# Patient Record
Sex: Female | Born: 1935
Health system: Southern US, Community
[De-identification: ages and names within clinical notes are randomized; demographics above are authoritative.]

## PROBLEM LIST (undated history)

## (undated) DIAGNOSIS — E119 Type 2 diabetes mellitus without complications: Secondary | ICD-10-CM

## (undated) HISTORY — PX: EYE SURGERY: SHX253

## (undated) HISTORY — PX: CHOLECYSTECTOMY: SHX55

## (undated) HISTORY — PX: OTHER SURGICAL HISTORY: SHX169

---

## 2020-09-18 DIAGNOSIS — Z794 Long term (current) use of insulin: Secondary | ICD-10-CM | POA: Diagnosis not present

## 2020-09-18 DIAGNOSIS — E876 Hypokalemia: Secondary | ICD-10-CM | POA: Diagnosis not present

## 2020-09-18 DIAGNOSIS — E1169 Type 2 diabetes mellitus with other specified complication: Secondary | ICD-10-CM | POA: Diagnosis not present

## 2020-10-09 ENCOUNTER — Encounter: Payer: Self-pay | Admitting: Podiatry

## 2020-10-09 DIAGNOSIS — E11621 Type 2 diabetes mellitus with foot ulcer: Secondary | ICD-10-CM | POA: Diagnosis not present

## 2020-10-09 DIAGNOSIS — L97529 Non-pressure chronic ulcer of other part of left foot with unspecified severity: Secondary | ICD-10-CM | POA: Diagnosis not present

## 2020-10-09 DIAGNOSIS — Z794 Long term (current) use of insulin: Secondary | ICD-10-CM | POA: Diagnosis not present

## 2020-10-11 ENCOUNTER — Ambulatory Visit: Payer: Medicare HMO

## 2020-10-11 ENCOUNTER — Ambulatory Visit: Payer: Medicare HMO | Admitting: Podiatry

## 2020-10-11 ENCOUNTER — Other Ambulatory Visit: Payer: Self-pay

## 2020-10-11 DIAGNOSIS — E08621 Diabetes mellitus due to underlying condition with foot ulcer: Secondary | ICD-10-CM | POA: Diagnosis not present

## 2020-10-11 DIAGNOSIS — L97501 Non-pressure chronic ulcer of other part of unspecified foot limited to breakdown of skin: Secondary | ICD-10-CM | POA: Diagnosis not present

## 2020-10-14 ENCOUNTER — Encounter: Payer: Self-pay | Admitting: Podiatry

## 2020-10-14 NOTE — Progress Notes (Signed)
  Subjective:  Patient ID: Patricia Green, female    DOB: [birth year],  MRN: [id number]  Chief Complaint  Patient presents with  . Diabetic Ulcer    PCP has referred pt to get ulcer checked out- pt mentioned she has been applying ointments on wound to help it heal- further eval     87 y.o. female presents with the above complaint. History confirmed with patient.  Here with family today.  Referred by her PCP.  Has not had much drainage.  There has been slight bleeding.  Her A1c is "7 something".  Objective:  Physical Exam: warm, good capillary refill and normal DP and PT pulses.  Hammertoe deformities on left foot.  She has a submetatarsal ulceration measuring 1 cm x 0.3 cm x 0.2 cm after debridement.  Hyperkeratosis, no signs of infection     Assessment:   1. Diabetic ulcer of foot associated with diabetes mellitus due to underlying condition, limited to breakdown of skin, unspecified laterality, unspecified part of foot (HCC)      Plan:  Patient was evaluated and treated and all questions answered.  Patient educated on diabetes. Discussed proper diabetic foot care and discussed risks and complications of disease. Educated patient in depth on reasons to return to the office immediately should he/she discover anything concerning or new on the feet. All questions answered. Discussed proper shoes as well.   Ulcer submet 5 left -Debridement as below. -Dressed with Iodosorb, DSD. -Applying Bactroban at home, continue this -Surgical shoe with peg assist dispensed  Procedure: Excisional Debridement of Wound Rationale: Removal of non-viable soft tissue from the wound to promote healing.  Anesthesia: none Pre-Debridement Wound Measurements: Unmeasurable due to hyperkeratosis Post-Debridement Wound Measurements: 1 cm x 0.3 cm x 0.2 cm Type of Debridement: Sharp Excisional Tissue Removed: Non-viable soft tissue Depth of Debridement: subcutaneous tissue. Technique: Sharp excisional  debridement to bleeding, viable wound base.  Dressing: Dry, sterile, compression dressing. Disposition: Patient tolerated procedure well. Patient to return in 1 week for follow-up.  Return in about 3 weeks (around 11/01/2020).       Return in about 3 weeks (around 11/01/2020).

## 2020-11-01 ENCOUNTER — Other Ambulatory Visit: Payer: Self-pay

## 2020-11-01 ENCOUNTER — Ambulatory Visit: Payer: Medicare HMO | Admitting: Podiatry

## 2020-11-01 DIAGNOSIS — L97501 Non-pressure chronic ulcer of other part of unspecified foot limited to breakdown of skin: Secondary | ICD-10-CM

## 2020-11-01 DIAGNOSIS — E08621 Diabetes mellitus due to underlying condition with foot ulcer: Secondary | ICD-10-CM | POA: Diagnosis not present

## 2020-11-04 ENCOUNTER — Encounter: Payer: Self-pay | Admitting: Podiatry

## 2020-11-04 NOTE — Progress Notes (Signed)
  Subjective:  Patient ID: Patricia Green, female    DOB: Oct 04, 1929,  MRN: [id number]  Chief Complaint  Patient presents with  . Foot Ulcer    PT stated that she is doing okay she stated that she does still have some bleeding and some pain.     87 y.o. female returns with the above complaint. History confirmed with patient.  Here again with her son.Marland Kitchen  Referred by her PCP.  Overall seems to be improving  Objective:  Physical Exam: warm, good capillary refill and normal DP and PT pulses.  Hammertoe deformities on left foot.  She has a submetatarsal ulceration measuring 0.5 cm x 0.3 cm x 0.1 cm after debridement.  Hyperkeratosis, no signs of infection       Assessment:   No diagnosis found.   Plan:  Patient was evaluated and treated and all questions answered.  Patient educated on diabetes. Discussed proper diabetic foot care and discussed risks and complications of disease. Educated patient in depth on reasons to return to the office immediately should he/she discover anything concerning or new on the feet. All questions answered. Discussed proper shoes as well.   Ulcer submet 5 left -Debridement as below. -Dressed with Iodosorb, DSD. -Continue applying Bactroban at home, continue this -Surgical shoe with peg assist dispensed, she should continue wearing this it is been helping quite a bit  Procedure: Excisional Debridement of Wound Rationale: Removal of non-viable soft tissue from the wound to promote healing.  Anesthesia: none Pre-Debridement Wound Measurements: 0.3 x 0.1 x 0.1 Post-Debridement Wound Measurements: 0.5 cm x 0.3 cm x 0.1 cm  Type of Debridement Sharp selective l Tissue Removed: Non-viable soft tissue Depth of Debridement: subcutaneous tissue. Technique: Sharp selective debridement to bleeding, viable wound base.  Dressing: Dry, sterile, compression dressing. Disposition: Patient tolerated procedure well. Patient to return in 1 week for follow-up.  Return in  about 3 weeks (around 11/22/2020).       Return in about 3 weeks (around 11/22/2020).

## 2020-11-09 ENCOUNTER — Ambulatory Visit: Payer: Medicare HMO

## 2020-11-09 ENCOUNTER — Other Ambulatory Visit: Payer: Self-pay

## 2020-11-09 DIAGNOSIS — E08621 Diabetes mellitus due to underlying condition with foot ulcer: Secondary | ICD-10-CM

## 2020-11-09 DIAGNOSIS — L97501 Non-pressure chronic ulcer of other part of unspecified foot limited to breakdown of skin: Secondary | ICD-10-CM

## 2020-11-12 NOTE — Progress Notes (Signed)
Pt was measured for diabetic shoes and inserts. Pt did have a left plantar foot ulcer present. Area was clean and dry. Pt will be contacted when shoes are ready for pick up.

## 2020-11-26 ENCOUNTER — Ambulatory Visit: Payer: Medicare HMO | Admitting: Podiatry

## 2020-11-26 ENCOUNTER — Encounter: Payer: Self-pay | Admitting: Podiatry

## 2020-11-26 ENCOUNTER — Other Ambulatory Visit: Payer: Self-pay

## 2020-11-26 DIAGNOSIS — E08621 Diabetes mellitus due to underlying condition with foot ulcer: Secondary | ICD-10-CM

## 2020-11-26 DIAGNOSIS — L97501 Non-pressure chronic ulcer of other part of unspecified foot limited to breakdown of skin: Secondary | ICD-10-CM | POA: Diagnosis not present

## 2020-11-26 NOTE — Progress Notes (Signed)
  Subjective:  Patient ID: Patricia Green, female    DOB: 1930-03-26,  MRN: [id number]  Chief Complaint  Patient presents with  . Foot Ulcer    Left foot diabetic ulcer, PT stated that she is doing well she stated that she has no concerns and denies any pain    87 y.o. female returns with the above complaint. History confirmed with patient.  Here again with her son.Marland Kitchen  Referred by her PCP.  Overall seems to be improving  Objective:  Physical Exam: warm, good capillary refill and normal DP and PT pulses.  Hammertoe deformities on left foot.  She has a submetatarsal ulceration measuring 0.3 cm x 0.2 cm x 0.1 cm after debridement.  Hyperkeratosis, no signs of infection.  Post debridement the only thing remains a small scab of dried blood, has effectively healed       Assessment:   No diagnosis found.   Plan:  Patient was evaluated and treated and all questions answered.  Patient educated on diabetes. Discussed proper diabetic foot care and discussed risks and complications of disease. Educated patient in depth on reasons to return to the office immediately should he/she discover anything concerning or new on the feet. All questions answered. Discussed proper shoes as well.   Ulcer submet 5 left -Has effectively healed -We [place]ako see her back in a few weeks to make sure it remains healed -Debridement as below. -No dressing today -Surgical shoe with peg assist dispensed, she should continue wearing this for 1 additional week and then she can return to a soft supportive sneaker  Procedure: Excisional Debridement of Wound Rationale: Removal of non-viable soft tissue from the wound to promote healing.  Anesthesia: none Pre-Debridement Wound Measurements: 0.3 x 0.1 x 0.1 Post-Debridement Wound Measurements: 0.1 x 0.1 x0.1 Type of Debridement Sharp selective l Tissue Removed: Non-viable soft tissue Depth of Debridement: subcutaneous tissue. Technique: Sharp selective debridement to  bleeding, viable wound base.  Dressing: Dry, sterile, compression dressing. Disposition: Patient tolerated procedure well.   Return in about 3 weeks (around 12/17/2020) for wound re-check left foot.

## 2020-11-26 NOTE — Patient Instructions (Signed)
Wear the offloading shoe for 1 more week then wear a sneaker when walking. If the wound gets bigger, or has drainage, go back to the shoe.

## 2020-12-17 ENCOUNTER — Encounter: Payer: Self-pay | Admitting: Podiatry

## 2020-12-17 ENCOUNTER — Other Ambulatory Visit: Payer: Self-pay

## 2020-12-17 ENCOUNTER — Ambulatory Visit: Payer: Medicare HMO | Admitting: Podiatry

## 2020-12-17 DIAGNOSIS — E08621 Diabetes mellitus due to underlying condition with foot ulcer: Secondary | ICD-10-CM | POA: Diagnosis not present

## 2020-12-17 DIAGNOSIS — M2042 Other hammer toe(s) (acquired), left foot: Secondary | ICD-10-CM

## 2020-12-17 DIAGNOSIS — L97501 Non-pressure chronic ulcer of other part of unspecified foot limited to breakdown of skin: Secondary | ICD-10-CM | POA: Diagnosis not present

## 2020-12-17 DIAGNOSIS — L97522 Non-pressure chronic ulcer of other part of left foot with fat layer exposed: Secondary | ICD-10-CM

## 2020-12-17 DIAGNOSIS — M2041 Other hammer toe(s) (acquired), right foot: Secondary | ICD-10-CM

## 2020-12-17 NOTE — Progress Notes (Signed)
  Subjective:  Patient ID: Patricia Green, female    DOB: Jun 15, 1930,  MRN: [id number]  Chief Complaint  Patient presents with  . Diabetic Ulcer       3wk f/u diab ulcer left foot    87 y.o. female returns with the above complaint. History confirmed with patient.  Here again with her son.Marland Kitchen  Referred by her PCP.  Feels like it is gotten worse, has been draining again  Objective:  Physical Exam: warm, good capillary refill and normal DP and PT pulses.  Hammertoe deformities on left foot.  She has a submetatarsal 5 ulceration measuring 1.0 x 0.6 x 0.2 cm after debridement.  Surrounding hyperkeratosis, no signs of infection.  Submetatarsal callus on the left submet 1         Assessment:   1. Diabetic ulcer of left foot associated with diabetes mellitus due to underlying condition, with fat layer exposed, unspecified part of foot (HCC)   2. Hammertoe of left foot   3. Hammertoe of right foot      Plan:  Patient was evaluated and treated and all questions answered.  Patient educated on diabetes. Discussed proper diabetic foot care and discussed risks and complications of disease. Educated patient in depth on reasons to return to the office immediately should he/she discover anything concerning or new on the feet. All questions answered. Discussed proper shoes as well.   Ulcer submet 5 left -Unfortunate has returned, things will continue to wax and wane until we get her in her diabetic shoes with offloading insoles -Continue mupirocin daily -Recommend she return to her surgical shoe with peg assist device -Hopefully will have shoes by next visit  Procedure: Excisional Debridement of Wound Rationale: Removal of non-viable soft tissue from the wound to promote healing.  Anesthesia: none Pre-Debridement Wound Measurements: 0.3 x 0.2 x 0.1 cm Post-Debridement Wound Measurements: 1.0 x 0.6 x 0.2 cm  Type of Debridement Sharp excisional l Tissue Removed: Non-viable soft tissue Depth of  Debridement: subcutaneous tissue. Technique: Sharp excisional debridement to bleeding, viable wound base.  Dressing: Dry, sterile, compression dressing. Disposition: Patient tolerated procedure well.   No follow-ups on file.

## 2020-12-19 DIAGNOSIS — Z794 Long term (current) use of insulin: Secondary | ICD-10-CM | POA: Diagnosis not present

## 2020-12-19 DIAGNOSIS — E876 Hypokalemia: Secondary | ICD-10-CM | POA: Diagnosis not present

## 2020-12-19 DIAGNOSIS — R7989 Other specified abnormal findings of blood chemistry: Secondary | ICD-10-CM | POA: Diagnosis not present

## 2020-12-19 DIAGNOSIS — E1169 Type 2 diabetes mellitus with other specified complication: Secondary | ICD-10-CM | POA: Diagnosis not present

## 2020-12-20 ENCOUNTER — Emergency Department (HOSPITAL_COMMUNITY): Payer: Medicare HMO

## 2020-12-20 ENCOUNTER — Other Ambulatory Visit: Payer: Self-pay

## 2020-12-20 ENCOUNTER — Encounter (HOSPITAL_COMMUNITY): Payer: Self-pay | Admitting: Emergency Medicine

## 2020-12-20 ENCOUNTER — Inpatient Hospital Stay (HOSPITAL_COMMUNITY)
Admission: EM | Admit: 2020-12-20 | Discharge: 2020-12-27 | DRG: 522 | Disposition: A | Discharge status: Skilled Nursing Facility | Payer: Medicare HMO | Attending: Family Medicine | Admitting: Family Medicine

## 2020-12-20 DIAGNOSIS — Z20822 Contact with and (suspected) exposure to covid-19: Secondary | ICD-10-CM | POA: Diagnosis not present

## 2020-12-20 DIAGNOSIS — T383X5A Adverse effect of insulin and oral hypoglycemic [antidiabetic] drugs, initial encounter: Secondary | ICD-10-CM | POA: Diagnosis not present

## 2020-12-20 DIAGNOSIS — R0902 Hypoxemia: Secondary | ICD-10-CM | POA: Diagnosis not present

## 2020-12-20 DIAGNOSIS — E440 Moderate protein-calorie malnutrition: Secondary | ICD-10-CM | POA: Insufficient documentation

## 2020-12-20 DIAGNOSIS — S72001D Fracture of unspecified part of neck of right femur, subsequent encounter for closed fracture with routine healing: Secondary | ICD-10-CM | POA: Diagnosis not present

## 2020-12-20 DIAGNOSIS — F0390 Unspecified dementia without behavioral disturbance: Secondary | ICD-10-CM | POA: Diagnosis present

## 2020-12-20 DIAGNOSIS — E11621 Type 2 diabetes mellitus with foot ulcer: Secondary | ICD-10-CM

## 2020-12-20 DIAGNOSIS — D649 Anemia, unspecified: Secondary | ICD-10-CM | POA: Diagnosis not present

## 2020-12-20 DIAGNOSIS — Z7982 Long term (current) use of aspirin: Secondary | ICD-10-CM

## 2020-12-20 DIAGNOSIS — L97529 Non-pressure chronic ulcer of other part of left foot with unspecified severity: Secondary | ICD-10-CM | POA: Diagnosis present

## 2020-12-20 DIAGNOSIS — E11649 Type 2 diabetes mellitus with hypoglycemia without coma: Secondary | ICD-10-CM | POA: Diagnosis present

## 2020-12-20 DIAGNOSIS — E1122 Type 2 diabetes mellitus with diabetic chronic kidney disease: Secondary | ICD-10-CM | POA: Diagnosis present

## 2020-12-20 DIAGNOSIS — D72828 Other elevated white blood cell count: Secondary | ICD-10-CM | POA: Diagnosis present

## 2020-12-20 DIAGNOSIS — D631 Anemia in chronic kidney disease: Secondary | ICD-10-CM | POA: Diagnosis present

## 2020-12-20 DIAGNOSIS — M25572 Pain in left ankle and joints of left foot: Secondary | ICD-10-CM | POA: Diagnosis not present

## 2020-12-20 DIAGNOSIS — D62 Acute posthemorrhagic anemia: Secondary | ICD-10-CM | POA: Diagnosis not present

## 2020-12-20 DIAGNOSIS — Z681 Body mass index (BMI) 19 or less, adult: Secondary | ICD-10-CM | POA: Diagnosis not present

## 2020-12-20 DIAGNOSIS — E1165 Type 2 diabetes mellitus with hyperglycemia: Secondary | ICD-10-CM | POA: Diagnosis not present

## 2020-12-20 DIAGNOSIS — E160 Drug-induced hypoglycemia without coma: Secondary | ICD-10-CM

## 2020-12-20 DIAGNOSIS — Z471 Aftercare following joint replacement surgery: Secondary | ICD-10-CM | POA: Diagnosis not present

## 2020-12-20 DIAGNOSIS — Z794 Long term (current) use of insulin: Secondary | ICD-10-CM

## 2020-12-20 DIAGNOSIS — M25551 Pain in right hip: Secondary | ICD-10-CM | POA: Diagnosis not present

## 2020-12-20 DIAGNOSIS — W010XXA Fall on same level from slipping, tripping and stumbling without subsequent striking against object, initial encounter: Secondary | ICD-10-CM | POA: Diagnosis not present

## 2020-12-20 DIAGNOSIS — Z66 Do not resuscitate: Secondary | ICD-10-CM | POA: Diagnosis present

## 2020-12-20 DIAGNOSIS — S72011A Unspecified intracapsular fracture of right femur, initial encounter for closed fracture: Secondary | ICD-10-CM | POA: Diagnosis not present

## 2020-12-20 DIAGNOSIS — L97509 Non-pressure chronic ulcer of other part of unspecified foot with unspecified severity: Secondary | ICD-10-CM

## 2020-12-20 DIAGNOSIS — Z87891 Personal history of nicotine dependence: Secondary | ICD-10-CM | POA: Diagnosis not present

## 2020-12-20 DIAGNOSIS — W19XXXA Unspecified fall, initial encounter: Secondary | ICD-10-CM | POA: Diagnosis not present

## 2020-12-20 DIAGNOSIS — Z79899 Other long term (current) drug therapy: Secondary | ICD-10-CM

## 2020-12-20 DIAGNOSIS — E162 Hypoglycemia, unspecified: Secondary | ICD-10-CM | POA: Diagnosis not present

## 2020-12-20 DIAGNOSIS — Z743 Need for continuous supervision: Secondary | ICD-10-CM | POA: Diagnosis not present

## 2020-12-20 DIAGNOSIS — S72001A Fracture of unspecified part of neck of right femur, initial encounter for closed fracture: Secondary | ICD-10-CM | POA: Diagnosis not present

## 2020-12-20 DIAGNOSIS — Z96641 Presence of right artificial hip joint: Secondary | ICD-10-CM | POA: Diagnosis not present

## 2020-12-20 DIAGNOSIS — T148XXA Other injury of unspecified body region, initial encounter: Secondary | ICD-10-CM

## 2020-12-20 DIAGNOSIS — N1831 Chronic kidney disease, stage 3a: Secondary | ICD-10-CM | POA: Diagnosis present

## 2020-12-20 DIAGNOSIS — N179 Acute kidney failure, unspecified: Secondary | ICD-10-CM

## 2020-12-20 DIAGNOSIS — Z9049 Acquired absence of other specified parts of digestive tract: Secondary | ICD-10-CM

## 2020-12-20 DIAGNOSIS — S72031A Displaced midcervical fracture of right femur, initial encounter for closed fracture: Secondary | ICD-10-CM | POA: Diagnosis not present

## 2020-12-20 DIAGNOSIS — Z419 Encounter for procedure for purposes other than remedying health state, unspecified: Secondary | ICD-10-CM

## 2020-12-20 DIAGNOSIS — N183 Chronic kidney disease, stage 3 unspecified: Secondary | ICD-10-CM

## 2020-12-20 DIAGNOSIS — S72009A Fracture of unspecified part of neck of unspecified femur, initial encounter for closed fracture: Secondary | ICD-10-CM | POA: Diagnosis not present

## 2020-12-20 HISTORY — DX: Type 2 diabetes mellitus without complications: E11.9

## 2020-12-20 LAB — CBC
HCT: 35.8 % — ABNORMAL LOW (ref 36.0–46.0)
Hemoglobin: 11.9 g/dL — ABNORMAL LOW (ref 12.0–15.0)
MCH: 30.1 pg (ref 26.0–34.0)
MCHC: 33.2 g/dL (ref 30.0–36.0)
MCV: 90.4 fL (ref 80.0–100.0)
Platelets: 237 10*3/uL (ref 150–400)
RBC: 3.96 MIL/uL (ref 3.87–5.11)
RDW: 12.5 % (ref 11.5–15.5)
WBC: 17.4 10*3/uL — ABNORMAL HIGH (ref 4.0–10.5)
nRBC: 0 % (ref 0.0–0.2)

## 2020-12-20 LAB — TYPE AND SCREEN
ABO/RH(D): O POS
Antibody Screen: NEGATIVE

## 2020-12-20 LAB — BASIC METABOLIC PANEL
Anion gap: 9 (ref 5–15)
BUN: 37 mg/dL — ABNORMAL HIGH (ref 8–23)
CO2: 22 mmol/L (ref 22–32)
Calcium: 9.1 mg/dL (ref 8.9–10.3)
Chloride: 111 mmol/L (ref 98–111)
Creatinine, Ser: 1.09 mg/dL — ABNORMAL HIGH (ref 0.44–1.00)
GFR, Estimated: 49 mL/min — ABNORMAL LOW (ref >60)
Glucose, Bld: 48 mg/dL — ABNORMAL LOW (ref 70–99)
Potassium: 4 mmol/L (ref 3.5–5.1)
Sodium: 142 mmol/L (ref 135–145)

## 2020-12-20 LAB — HEMOGLOBIN A1C
Hgb A1c MFr Bld: 6 % — ABNORMAL HIGH (ref 4.8–5.6)
Mean Plasma Glucose: 125.5 mg/dL

## 2020-12-20 LAB — SURGICAL PCR SCREEN
MRSA, PCR: NEGATIVE
Staphylococcus aureus: NEGATIVE

## 2020-12-20 LAB — RESP PANEL BY RT-PCR (FLU A&B, COVID) ARPGX2
Influenza A by PCR: NEGATIVE
Influenza B by PCR: NEGATIVE
SARS Coronavirus 2 by RT PCR: NEGATIVE

## 2020-12-20 LAB — CBG MONITORING, ED: Glucose-Capillary: 129 mg/dL — ABNORMAL HIGH (ref 70–99)

## 2020-12-20 LAB — GLUCOSE, CAPILLARY
Glucose-Capillary: 123 mg/dL — ABNORMAL HIGH (ref 70–99)
Glucose-Capillary: 181 mg/dL — ABNORMAL HIGH (ref 70–99)

## 2020-12-20 MED ORDER — SODIUM CHLORIDE 0.9 % IV BOLUS
1000.0000 mL | Freq: Once | INTRAVENOUS | Status: AC
  Administered 2020-12-20: 1000 mL via INTRAVENOUS

## 2020-12-20 MED ORDER — TRANEXAMIC ACID-NACL 1000-0.7 MG/100ML-% IV SOLN
1000.0000 mg | INTRAVENOUS | Status: AC
  Administered 2020-12-21: 1000 mg via INTRAVENOUS
  Filled 2020-12-20: qty 100

## 2020-12-20 MED ORDER — HEPARIN SODIUM (PORCINE) 5000 UNIT/ML IJ SOLN
5000.0000 [IU] | Freq: Three times a day (TID) | INTRAMUSCULAR | Status: DC
  Administered 2020-12-20: 5000 [IU] via SUBCUTANEOUS
  Filled 2020-12-20: qty 1

## 2020-12-20 MED ORDER — TRANEXAMIC ACID 1000 MG/10ML IV SOLN
2000.0000 mg | INTRAVENOUS | Status: DC
  Filled 2020-12-20 (×2): qty 20

## 2020-12-20 MED ORDER — HYDROCODONE-ACETAMINOPHEN 5-325 MG PO TABS
1.0000 | ORAL_TABLET | Freq: Four times a day (QID) | ORAL | Status: DC | PRN
  Administered 2020-12-20 – 2020-12-21 (×2): 1 via ORAL
  Filled 2020-12-20 (×2): qty 1

## 2020-12-20 MED ORDER — INSULIN ASPART 100 UNIT/ML ~~LOC~~ SOLN: 0.0000 [IU] | Freq: Three times a day (TID) | SUBCUTANEOUS | Status: DC

## 2020-12-20 MED ORDER — DEXTROSE 50 % IV SOLN
25.0000 g | Freq: Once | INTRAVENOUS | Status: AC
  Administered 2020-12-20: 25 g via INTRAVENOUS
  Filled 2020-12-20: qty 50

## 2020-12-20 MED ORDER — POVIDONE-IODINE 10 % EX SWAB: 2.0000 "application " | Freq: Once | CUTANEOUS | Status: DC

## 2020-12-20 MED ORDER — ONDANSETRON HCL 4 MG/2ML IJ SOLN
4.0000 mg | Freq: Once | INTRAMUSCULAR | Status: AC
  Administered 2020-12-20: 4 mg via INTRAVENOUS
  Filled 2020-12-20: qty 2

## 2020-12-20 MED ORDER — HYDROMORPHONE HCL 1 MG/ML IJ SOLN
0.5000 mg | Freq: Once | INTRAMUSCULAR | Status: AC
  Administered 2020-12-20: 0.5 mg via INTRAVENOUS
  Filled 2020-12-20: qty 1

## 2020-12-20 MED ORDER — CEFAZOLIN SODIUM-DEXTROSE 2-4 GM/100ML-% IV SOLN
2.0000 g | INTRAVENOUS | Status: AC
  Administered 2020-12-21: 2 g via INTRAVENOUS
  Filled 2020-12-20: qty 100

## 2020-12-20 MED ORDER — MORPHINE SULFATE (PF) 2 MG/ML IV SOLN
0.5000 mg | INTRAVENOUS | Status: DC | PRN
  Administered 2020-12-20: 0.5 mg via INTRAVENOUS
  Filled 2020-12-20: qty 1

## 2020-12-20 NOTE — ED Triage Notes (Signed)
BIBA  Per EMS: Pt coming from home with a mechanical fall. Pt fell on R side. Pt complaining of R hip and leg pain. Shortening noted on R leg. Denies LOC; blood thinners.  Vitals WDL  156/94 94 HR  96% RA

## 2020-12-20 NOTE — Progress Notes (Signed)
I have talked with Dr. Denton Lank about this patient.  I will plan to perform surgery tomorrow at [place] [facility].  NPO after midnight.  Appreciate the transfer.  Hold lovenox.

## 2020-12-20 NOTE — ED Notes (Signed)
Pt given orange juice and water.

## 2020-12-20 NOTE — H&P (Signed)
History and Physical        Hospital Admission Note Date: 12/20/2020  Patient name: Patricia Green Medical record number: [id number] Date of birth: [birth year] Age: 87 y.o. Gender: female  PCP: Soundra Pilon, FNP    Chief Complaint    Chief Complaint  Patient presents with  . Fall      HPI:   This is an 87 year old female with a history of diabetes, left diabetic foot wound (sees podiatry) who presented to the ED s/p slip and fall with right hip pain at home prior to arrival today. Patient states that she has been wearing a boot prescribed by her podiatrist for her left foot wound. The boot slipped this morning on the floor prompting her to fall.   No loss of consciousness or head injury.  No neck, back, chest pain or shortness of breath.  No other issues.  Not on anticoagulants. Of note, she did take her insulin this morning but did not eat as she came to the ED.    ED Course: Afebrile, hemodynamically stable, on room air. Notable Labs: Glucose 48, BUN 37, creatinine 1.09, WBC 17.4, Hb 11.9, COVID-19 and flu negative and otherwise labs overall unremarkable. Notable Imaging: Right hip XR-subcapital right femoral neck fracture with displacement of fracture fragments. Patient received D50, Dilaudid, Zofran, 1 L NS bolus.  Orthopedic surgery consulted and plan for surgery tomorrow.    Vitals:   12/20/20 1115 12/20/20 1644  BP: (!) 149/63 (!) 151/89  Pulse: 77 89  Resp: (!) 24 18  Temp:  98.1 F (36.7 C)  SpO2: 95% 95%     Review of Systems:  Review of Systems  All other systems reviewed and are negative.   Medical/Social/Family History   Past Medical History: Past Medical History:  Diagnosis Date  . Diabetes mellitus without complication [facility])     Past Surgical History:  Procedure Laterality Date  . Bilateral Cataract extraction    . CHOLECYSTECTOMY    . EYE  SURGERY    . lumpectomy from both Breasts      Medications: Prior to Admission medications   Medication Sig Start Date End Date Taking? Authorizing Provider  aspirin 81 MG chewable tablet Chew 81 mg by mouth at bedtime.   Yes [provider]  insulin aspart (NOVOLOG FLEXPEN) 100 UNIT/ML FlexPen Inject 0-2 Units into the skin 3 (three) times daily between meals. Sliding scale   Yes [provider]  LEVEMIR FLEXTOUCH 100 UNIT/ML FlexPen Inject 10-18 Units into the skin See admin instructions. 18 units in the morning 10 units at night 10/11/20  Yes [provider]  Multiple Vitamin (MULTIVITAMIN WITH MINERALS) TABS tablet Take 1 tablet by mouth daily.   Yes [provider]  potassium chloride (MICRO-K) 10 MEQ CR capsule Take 10 mEq by mouth 2 (two) times daily. 10/11/20  Yes [provider]  ONETOUCH ULTRA test strip 1 each 3 (three) times daily. 11/25/20   [provider]    Allergies:  No Known Allergies  Social History:  reports that she has quit smoking. Her smoking use included cigarettes. She has never used smokeless tobacco. She reports that she does not drink alcohol and does  not use drugs.  Family History: History reviewed. No pertinent family history.   Objective   Physical Exam: Blood pressure (!) 151/89, pulse 89, temperature 98.1 F (36.7 C), temperature source Oral, resp. rate 18, SpO2 95 %.  Physical Exam Vitals and nursing note reviewed.  Constitutional:      Appearance: Normal appearance.  HENT:     Head: Normocephalic and atraumatic.  Eyes:     Conjunctiva/sclera: Conjunctivae normal.  Cardiovascular:     Rate and Rhythm: Normal rate and regular rhythm.     Pulses: Normal pulses.  Pulmonary:     Effort: Pulmonary effort is normal.     Breath sounds: Normal breath sounds.  Abdominal:     General: Abdomen is flat.     Palpations: Abdomen is soft.  Musculoskeletal:     Right lower leg: No edema.     Left  lower leg: No edema.     Comments: Slightly externally rotated right lower extremity  Skin:    Coloration: Skin is not jaundiced or pale.  Neurological:     Mental Status: She is alert. Mental status is at baseline.  Psychiatric:        Mood and Affect: Mood normal.        Behavior: Behavior normal.     LABS on Admission: I have personally reviewed all the labs and imaging below    Basic Metabolic Panel: Recent Labs  Lab 12/20/20 1039  NA 142  K 4.0  CL 111  CO2 22  GLUCOSE 48*  BUN 37*  CREATININE 1.09*  CALCIUM 9.1   Liver Function Tests: No results for input(s): AST, ALT, ALKPHOS, BILITOT, PROT, ALBUMIN in the last 168 hours. No results for input(s): LIPASE, AMYLASE in the last 168 hours. No results for input(s): AMMONIA in the last 168 hours. CBC: Recent Labs  Lab 12/20/20 1039  WBC 17.4*  HGB 11.9*  HCT 35.8*  MCV 90.4  PLT 237   Cardiac Enzymes: No results for input(s): CKTOTAL, CKMB, CKMBINDEX, TROPONINI in the last 168 hours. BNP: Invalid input(s): POCBNP CBG: Recent Labs  Lab 12/20/20 1519 12/20/20 1655  GLUCAP 129* 123*    Radiological Exams on Admission:  DG Chest 1 View  Result Date: 12/20/2020 CLINICAL DATA:  Fall with right hip pain. EXAM: CHEST  1 VIEW COMPARISON:  None. FINDINGS: Cardiac silhouette is normal in size and configuration. Normal mediastinal and hilar contours. Lungs are hyperexpanded, but clear. No pleural effusion or pneumothorax. Skeletal structures are grossly intact. IMPRESSION: No acute cardiopulmonary disease. Electronically Signed   By: Amie Portland M.D.   On: 12/20/2020 11:37   DG HIP UNILAT W OR W/O PELVIS 2-3 VIEWS RIGHT  Result Date: 12/20/2020 CLINICAL DATA:  Pain following fall EXAM: DG HIP (WITH OR WITHOUT PELVIS) 2-3V RIGHT COMPARISON:  None. FINDINGS: Frontal pelvis as well as frontal and lateral right hip images were obtained. There is a subcapital femoral neck fracture on the right. There is superior and lateral  displacement of the femoral neck and shaft with respect to the femoral head. No other fracture. No dislocation. Moderate symmetric narrowing noted each hip joint. IMPRESSION: Subcapital femoral neck fracture on the right with displacement of fracture fragments. No other fracture. No dislocation. Symmetric moderate narrowing of each hip joint noted. Electronically Signed   By: Bretta Bang III M.D.   On: 12/20/2020 11:37      EKG: normal EKG, normal sinus rhythm   A & P   Principal Problem:  Closed subcapital fracture of femur, right, initial encounter [facility]) Active Problems:   Type 2 diabetes mellitus with foot ulcer (HCC)   Hypoglycemia due to insulin   1. Acute right subcapital femoral neck fracture with displacement of fracture fragments s/p mechanical fall from standing height a. Fell after her LLE boot slipped on the floor b. Ortho consulted, plan for transfer to [facility] for surgery tomorrow c. NPO after midnight d. PT eval when able e. Pain management per hip fracture order set  2. Diabetes with hypoglycemia a. Hypoglycemia likely from taking insulin last night and this morning without eating during the day. Resolved with PO intake and D50 b. Heart healthy/carb modified diet c. Sensitive sliding scale for now  3. Left diabetic foot wound, stable a. Outpatient follow up  4. Reactive leukocytosis    DVT prophylaxis: heparin   Code Status: DNR  Diet: heart healthy/carb modified Family Communication: Admission, patients condition and plan of care including tests being ordered have been discussed with the patient who indicates understanding and agrees with the plan and Code Status. Patient's family was updated  Disposition Plan: The appropriate patient status for this patient is INPATIENT. Inpatient status is judged to be reasonable and necessary in order to provide the required intensity of service to ensure the patient's safety. The patient's presenting symptoms, physical  exam findings, and initial radiographic and laboratory data in the context of their chronic comorbidities is felt to place them at high risk for further clinical deterioration. Furthermore, it is not anticipated that the patient will be medically stable for discharge from the hospital within 2 midnights of admission. The following factors support the patient status of inpatient.   " The patient's presenting symptoms include fall. " The worrisome physical exam findings include right hip pain. " The initial radiographic and laboratory data are worrisome because of right hip fracture. " The chronic co-morbidities include diabetes.   * I certify that at the point of admission it is my clinical judgment that the patient will require inpatient hospital care spanning beyond 2 midnights from the point of admission due to high intensity of service, high risk for further deterioration and high frequency of surveillance required.*    Consultants  . ortho  Procedures  . none  Time Spent on Admission: 55 minutes    Jae Dire, DO [facility]  12/20/2020, 5:20 PM

## 2020-12-20 NOTE — Consult Note (Signed)
Reason for Consult:Right hip fx Referring Physician: Manus Rudd Time called: 1258 Time at bedside: 1413   Patricia Green is an 87 y.o. female.  HPI: Patricia Green was at home getting something out of a closet when she tripped and fell. She had immediate right hip pain and was only able to get up with her son's help. She was brought to the ED where x-rays showed a right hip fx and orthopedic surgery was consulted. She c/o localized pain to the hip. She does not usually ambulate with any assistive devices.  History reviewed. No pertinent past medical history.  History reviewed. No pertinent surgical history.  History reviewed. No pertinent family history.  Social History:  has no history on file for tobacco use, alcohol use, and drug use.  Allergies: No Known Allergies  Medications: I have reviewed the patient's current medications.  Results for orders placed or performed during the hospital encounter of 12/20/20 (from the past 48 hour(s))  CBC     Status: Abnormal   Collection Time: 12/20/20 10:39 AM  Result Value Ref Range   WBC 17.4 (H) 4.0 - 10.5 K/uL   RBC 3.96 3.87 - 5.11 MIL/uL   Hemoglobin 11.9 (L) 12.0 - 15.0 g/dL   HCT 01.6 (L) 01.0 - 93.2 %   MCV 90.4 80.0 - 100.0 fL   MCH 30.1 26.0 - 34.0 pg   MCHC 33.2 30.0 - 36.0 g/dL   RDW 35.5 73.2 - 20.2 %   Platelets 237 150 - 400 K/uL   nRBC 0.0 0.0 - 0.2 %    Comment: Performed at [facility], [address], [place], [place] [address]  Type and screen     Status: None   Collection Time: 12/20/20 10:39 AM  Result Value Ref Range   ABO/RH(D) O POS    Antibody Screen NEG    Sample Expiration      12/23/2020,2359 Performed at [facility], [address], [place], [place] [address]   Basic metabolic panel     Status: Abnormal   Collection Time: 12/20/20 10:39 AM  Result Value Ref Range   Sodium 142 135 - 145 mmol/L   Potassium 4.0 3.5 - 5.1 mmol/L   Chloride 111 98 - 111 mmol/L   CO2 22 22 -  32 mmol/L   Glucose, Bld 48 (L) 70 - 99 mg/dL    Comment: Glucose reference range applies only to samples taken after fasting for at least 8 hours.   BUN 37 (H) 8 - 23 mg/dL   Creatinine, Ser 2.83 (H) 0.44 - 1.00 mg/dL   Calcium 9.1 8.9 - 15.1 mg/dL   GFR, Estimated 49 (L) >60 mL/min    Comment: (NOTE) Calculated using the CKD-EPI Creatinine Equation (2021)    Anion gap 9 5 - 15    Comment: Performed at [facility], [address] [place], [place] [address]  Resp Panel by RT-PCR (Flu A&B, Covid) Nasopharyngeal Swab     Status: None   Collection Time: 12/20/20 10:39 AM   Specimen: Nasopharyngeal Swab; Nasopharyngeal(NP) swabs in vial transport medium  Result Value Ref Range   SARS Coronavirus 2 by RT PCR NEGATIVE NEGATIVE    Comment: (NOTE) SARS-CoV-2 target nucleic acids are NOT DETECTED.  The SARS-CoV-2 RNA is generally detectable in upper respiratory specimens during the acute phase of infection. The lowest concentration of SARS-CoV-2 viral copies this assay can detect is 138 copies/mL. A negative result does not preclude SARS-Cov-2 infection and  should not be used as the sole basis for treatment or other patient management decisions. A negative result may occur with  improper specimen collection/handling, submission of specimen other than nasopharyngeal swab, presence of viral mutation(s) within the areas targeted by this assay, and inadequate number of viral copies(<138 copies/mL). A negative result must be combined with clinical observations, patient history, and epidemiological information. The expected result is Negative.  Fact Sheet for Patients:  BloggerCourse.com  Fact Sheet for Healthcare Providers:  SeriousBroker.it  This test is no t yet approved or cleared by the [place] FDA and  has been authorized for detection and/or diagnosis of SARS-CoV-2 by FDA under an Emergency Use Authorization  (EUA). This EUA will remain  in effect (meaning this test can be used) for the duration of the COVID-19 declaration under Section 564(b)(1) of the Act, 21 U.S.C.section 360bbb-3(b)(1), unless the authorization is terminated  or revoked sooner.       Influenza A by PCR NEGATIVE NEGATIVE   Influenza B by PCR NEGATIVE NEGATIVE    Comment: (NOTE) The Xpert Xpress SARS-CoV-2/FLU/RSV plus assay is intended as an aid in the diagnosis of influenza from Nasopharyngeal swab specimens and should not be used as a sole basis for treatment. Nasal washings and aspirates are unacceptable for Xpert Xpress SARS-CoV-2/FLU/RSV testing.  Fact Sheet for Patients: BloggerCourse.com  Fact Sheet for Healthcare Providers: SeriousBroker.it  This test is not yet approved or cleared by the [place] FDA and has been authorized for detection and/or diagnosis of SARS-CoV-2 by FDA under an Emergency Use Authorization (EUA). This EUA will remain in effect (meaning this test can be used) for the duration of the COVID-19 declaration under Section 564(b)(1) of the Act, 21 U.S.C. section 360bbb-3(b)(1), unless the authorization is terminated or revoked.  Performed at [facility], [address], [place], [place] [address]     DG Chest 1 View  Result Date: 12/20/2020 CLINICAL DATA:  Fall with right hip pain. EXAM: CHEST  1 VIEW COMPARISON:  None. FINDINGS: Cardiac silhouette is normal in size and configuration. Normal mediastinal and hilar contours. Lungs are hyperexpanded, but clear. No pleural effusion or pneumothorax. Skeletal structures are grossly intact. IMPRESSION: No acute cardiopulmonary disease. Electronically Signed   By: Amie Portland M.D.   On: 12/20/2020 11:37   DG HIP UNILAT W OR W/O PELVIS 2-3 VIEWS RIGHT  Result Date: 12/20/2020 CLINICAL DATA:  Pain following fall EXAM: DG HIP (WITH OR WITHOUT PELVIS) 2-3V RIGHT COMPARISON:   None. FINDINGS: Frontal pelvis as well as frontal and lateral right hip images were obtained. There is a subcapital femoral neck fracture on the right. There is superior and lateral displacement of the femoral neck and shaft with respect to the femoral head. No other fracture. No dislocation. Moderate symmetric narrowing noted each hip joint. IMPRESSION: Subcapital femoral neck fracture on the right with displacement of fracture fragments. No other fracture. No dislocation. Symmetric moderate narrowing of each hip joint noted. Electronically Signed   By: Bretta Bang III M.D.   On: 12/20/2020 11:37    Review of Systems  HENT: Negative for ear discharge, ear pain, hearing loss and tinnitus.   Eyes: Negative for photophobia and pain.  Respiratory: Negative for cough and shortness of breath.   Cardiovascular: Negative for chest pain.  Gastrointestinal: Negative for abdominal pain, nausea and vomiting.  Genitourinary: Negative for dysuria, flank pain, frequency and urgency.  Musculoskeletal: Positive for arthralgias (Right hip). Negative for back pain, myalgias and neck  pain.  Neurological: Negative for dizziness and headaches.  Hematological: Does not bruise/bleed easily.  Psychiatric/Behavioral: The patient is not nervous/anxious.    Blood pressure (!) 149/63, pulse 77, temperature 98.5 F (36.9 C), temperature source Oral, resp. rate (!) 24, SpO2 95 %. Physical Exam Constitutional:      General: She is not in acute distress.    Appearance: She is well-developed. She is not diaphoretic.  HENT:     Head: Normocephalic and atraumatic.  Eyes:     General: No scleral icterus.       Right eye: No discharge.        Left eye: No discharge.     Conjunctiva/sclera: Conjunctivae normal.  Cardiovascular:     Rate and Rhythm: Normal rate and regular rhythm.  Pulmonary:     Effort: Pulmonary effort is normal. No respiratory distress.  Musculoskeletal:     Cervical back: Normal range of motion.      Comments: RLE No traumatic wounds, ecchymosis, or rash  Mod TTP hip  No knee or ankle effusion  Knee stable to varus/ valgus and anterior/posterior stress  Sens DPN, SPN, TN intact  Motor EHL, ext, flex, evers 5/5  DP 2+, PT 2+, No significant edema  Skin:    General: Skin is warm and dry.  Neurological:     Mental Status: She is alert.  Psychiatric:        Behavior: Behavior normal.     Assessment/Plan: Right hip fx -- Plan THA tomorrow with Dr. Roda Shutters at [facility]. Please keep NPO after MN. Multiple medical problems including DM, diabetic foot ulcer, and  Dementia -- Have asked IM to admit and manage. Appreciate their help.    Freeman Caldron, PA-C Orthopedic Surgery [phone] 12/20/2020, 2:19 PM

## 2020-12-20 NOTE — ED Provider Notes (Addendum)
[facility]-EMERGENCY DEPT Provider Note   CSN: [id number] Arrival date & time: 12/20/20  8502     History Chief Complaint  Patient presents with  . Fall    Patricia Green is a 87 y.o. female.  Patient presents via EMS s/p slip and fall with right hip pain at home, just pta today. Symptoms acute onset, moderate, constant, dull, non radiating, worse w movement. Denies prior hip injury. No faintness or dizziness prior to fall, felt normal, at baseline today including just prior to her mechanical fall. Denies other pain or injury. No headache or loc. No neck or back pain. No chest pain or sob. No abd pain or nv. Skin is intact. No RLE numbness or weakness. No anticoagulant use.   The history is provided by the patient.  Fall Pertinent negatives include no chest pain, no abdominal pain, no headaches and no shortness of breath.       History reviewed. No pertinent past medical history.  There are no problems to display for this patient.   History reviewed. No pertinent surgical history.   OB History   No obstetric history on file.     History reviewed. No pertinent family history.     Home Medications Prior to Admission medications   Medication Sig Start Date End Date Taking? Authorizing Provider  doxycycline (VIBRAMYCIN) 100 MG capsule Take 100 mg by mouth 2 (two) times daily. 10/09/20   [provider]  LEVEMIR FLEXTOUCH 100 UNIT/ML FlexPen Inject into the skin. 10/11/20   [provider]  ONETOUCH ULTRA test strip 1 each 3 (three) times daily. 11/25/20   [provider]  potassium chloride (MICRO-K) 10 MEQ CR capsule Take by mouth 2 (two) times daily. 10/11/20   [provider]    Allergies    Patient has no allergy information on record.  Review of Systems   Review of Systems  Constitutional: Negative for chills and fever.  HENT: Negative for sore throat.   Eyes: Negative for redness.  Respiratory: Negative for  shortness of breath.   Cardiovascular: Negative for chest pain.  Gastrointestinal: Negative for abdominal pain, nausea and vomiting.  Genitourinary: Negative for flank pain.  Musculoskeletal: Negative for back pain and neck pain.  Skin: Negative for wound.  Neurological: Negative for headaches.  Hematological: Does not bruise/bleed easily.  Psychiatric/Behavioral: Negative for confusion.    Physical Exam Updated Vital Signs BP (!) 137/110   Pulse 72   Temp 98.5 F (36.9 C) (Oral)   Resp 20   SpO2 100%   Physical Exam Vitals and nursing note reviewed.  Constitutional:      Appearance: Normal appearance. She is well-developed.  HENT:     Head: Atraumatic.     Nose: Nose normal.     Mouth/Throat:     Mouth: Mucous membranes are moist.  Eyes:     General: No scleral icterus.    Conjunctiva/sclera: Conjunctivae normal.     Pupils: Pupils are equal, round, and reactive to light.  Neck:     Trachea: No tracheal deviation.  Cardiovascular:     Rate and Rhythm: Normal rate and regular rhythm.     Pulses: Normal pulses.     Heart sounds: Normal heart sounds. No murmur heard. No friction rub. No gallop.   Pulmonary:     Effort: Pulmonary effort is normal. No respiratory distress.     Breath sounds: Normal breath sounds.  Chest:     Chest wall: No tenderness.  Abdominal:     General: Bowel sounds are normal. There is no distension.     Palpations: Abdomen is soft.     Tenderness: There is no abdominal tenderness. There is no guarding.  Genitourinary:    Comments: No cva tenderness.  Musculoskeletal:        General: No swelling.     Cervical back: Normal range of motion and neck supple. No rigidity. No muscular tenderness.     Comments: CTLS spine, non tender, aligned, no step off. RLE shortening, pain right hip. Distal pulses palp.   Skin:    General: Skin is warm and dry.     Findings: No rash.  Neurological:     Mental Status: She is alert.     Comments: Alert,  speech normal. Motor/sens grossly intact bil. RLE nvi.   Psychiatric:        Mood and Affect: Mood normal.     ED Results / Procedures / Treatments   Labs (all labs ordered are listed, but only abnormal results are displayed) Results for orders placed or performed during the hospital encounter of 12/20/20  Resp Panel by RT-PCR (Flu A&B, Covid) Nasopharyngeal Swab   Specimen: Nasopharyngeal Swab; Nasopharyngeal(NP) swabs in vial transport medium  Result Value Ref Range   SARS Coronavirus 2 by RT PCR NEGATIVE NEGATIVE   Influenza A by PCR NEGATIVE NEGATIVE   Influenza B by PCR NEGATIVE NEGATIVE  CBC  Result Value Ref Range   WBC 17.4 (H) 4.0 - 10.5 K/uL   RBC 3.96 3.87 - 5.11 MIL/uL   Hemoglobin 11.9 (L) 12.0 - 15.0 g/dL   HCT 35.3 (L) 61.4 - 43.1 %   MCV 90.4 80.0 - 100.0 fL   MCH 30.1 26.0 - 34.0 pg   MCHC 33.2 30.0 - 36.0 g/dL   RDW 54.0 08.6 - 76.1 %   Platelets 237 150 - 400 K/uL   nRBC 0.0 0.0 - 0.2 %  Basic metabolic panel  Result Value Ref Range   Sodium 142 135 - 145 mmol/L   Potassium 4.0 3.5 - 5.1 mmol/L   Chloride 111 98 - 111 mmol/L   CO2 22 22 - 32 mmol/L   Glucose, Bld 48 (L) 70 - 99 mg/dL   BUN 37 (H) 8 - 23 mg/dL   Creatinine, Ser 9.50 (H) 0.44 - 1.00 mg/dL   Calcium 9.1 8.9 - 93.2 mg/dL   GFR, Estimated 49 (L) >60 mL/min   Anion gap 9 5 - 15  Type and screen  Result Value Ref Range   ABO/RH(D) O POS    Antibody Screen NEG    Sample Expiration      12/23/2020,2359 Performed at [facility], [address], [place], [place] [address]    DG Chest 1 View  Result Date: 12/20/2020 CLINICAL DATA:  Fall with right hip pain. EXAM: CHEST  1 VIEW COMPARISON:  None. FINDINGS: Cardiac silhouette is normal in size and configuration. Normal mediastinal and hilar contours. Lungs are hyperexpanded, but clear. No pleural effusion or pneumothorax. Skeletal structures are grossly intact. IMPRESSION: No acute cardiopulmonary disease. Electronically  Signed   By: Amie Portland M.D.   On: 12/20/2020 11:37   DG HIP UNILAT W OR W/O PELVIS 2-3 VIEWS RIGHT  Result Date: 12/20/2020 CLINICAL DATA:  Pain following fall EXAM: DG HIP (WITH OR WITHOUT PELVIS) 2-3V RIGHT COMPARISON:  None. FINDINGS: Frontal pelvis as well as frontal and lateral right hip images were obtained. There is a subcapital  femoral neck fracture on the right. There is superior and lateral displacement of the femoral neck and shaft with respect to the femoral head. No other fracture. No dislocation. Moderate symmetric narrowing noted each hip joint. IMPRESSION: Subcapital femoral neck fracture on the right with displacement of fracture fragments. No other fracture. No dislocation. Symmetric moderate narrowing of each hip joint noted. Electronically Signed   By: Bretta Bang III M.D.   On: 12/20/2020 11:37    EKG None  Radiology DG Chest 1 View  Result Date: 12/20/2020 CLINICAL DATA:  Fall with right hip pain. EXAM: CHEST  1 VIEW COMPARISON:  None. FINDINGS: Cardiac silhouette is normal in size and configuration. Normal mediastinal and hilar contours. Lungs are hyperexpanded, but clear. No pleural effusion or pneumothorax. Skeletal structures are grossly intact. IMPRESSION: No acute cardiopulmonary disease. Electronically Signed   By: Amie Portland M.D.   On: 12/20/2020 11:37   DG HIP UNILAT W OR W/O PELVIS 2-3 VIEWS RIGHT  Result Date: 12/20/2020 CLINICAL DATA:  Pain following fall EXAM: DG HIP (WITH OR WITHOUT PELVIS) 2-3V RIGHT COMPARISON:  None. FINDINGS: Frontal pelvis as well as frontal and lateral right hip images were obtained. There is a subcapital femoral neck fracture on the right. There is superior and lateral displacement of the femoral neck and shaft with respect to the femoral head. No other fracture. No dislocation. Moderate symmetric narrowing noted each hip joint. IMPRESSION: Subcapital femoral neck fracture on the right with displacement of fracture fragments. No  other fracture. No dislocation. Symmetric moderate narrowing of each hip joint noted. Electronically Signed   By: Bretta Bang III M.D.   On: 12/20/2020 11:37    Procedures Procedures   Medications Ordered in ED Medications  HYDROmorphone (DILAUDID) injection 0.5 mg (has no administration in time range)  ondansetron (ZOFRAN) injection 4 mg (has no administration in time range)  sodium chloride 0.9 % bolus 1,000 mL (has no administration in time range)    ED Course  I have reviewed the triage vital signs and the nursing notes.  Pertinent labs & imaging results that were available during my care of the patient were reviewed by me and considered in my medical decision making (see chart for details).    MDM Rules/Calculators/A&P                         Iv ns. Dilaudid iv.   Reviewed nursing notes and prior charts for additional history.   Labs and imaging ordered.   Xrays reviewed/interpreted by me - right hip fx.  Labs reviewed/interpreted by me - chem normal.   Recheck, pain improved.   Orthopedics consulted. Discussed w Dr Yevette Edwards - he indicates tentatively that they will plan surgery for tomorrow.   Dr Roda Shutters called back to say that they will plan surgery tomorrow around 12 noon at [facility] - I discussed w hospitalist, so that he will admit to [facility].  Glucose is low. D50 iv, po fluids/food.   Hospitalists consulted. Discussed pt - will admit.      Final Clinical Impression(s) / ED Diagnoses Final diagnoses:  None    Rx / DC Orders ED Discharge Orders    None         Cathren Laine, MD 12/20/20 1334

## 2020-12-21 ENCOUNTER — Encounter (HOSPITAL_COMMUNITY): Payer: Self-pay | Admitting: Internal Medicine

## 2020-12-21 ENCOUNTER — Inpatient Hospital Stay (HOSPITAL_COMMUNITY): Payer: Medicare HMO

## 2020-12-21 ENCOUNTER — Inpatient Hospital Stay (HOSPITAL_COMMUNITY): Payer: Medicare HMO | Admitting: Anesthesiology

## 2020-12-21 ENCOUNTER — Encounter (HOSPITAL_COMMUNITY)
Admission: EM | Disposition: A | Discharge status: Skilled Nursing Facility | Payer: Self-pay | Source: Home / Self Care | Attending: Internal Medicine

## 2020-12-21 DIAGNOSIS — E440 Moderate protein-calorie malnutrition: Secondary | ICD-10-CM

## 2020-12-21 DIAGNOSIS — N179 Acute kidney failure, unspecified: Secondary | ICD-10-CM

## 2020-12-21 DIAGNOSIS — S72011A Unspecified intracapsular fracture of right femur, initial encounter for closed fracture: Secondary | ICD-10-CM | POA: Diagnosis not present

## 2020-12-21 DIAGNOSIS — S72001D Fracture of unspecified part of neck of right femur, subsequent encounter for closed fracture with routine healing: Secondary | ICD-10-CM

## 2020-12-21 HISTORY — PX: TOTAL HIP ARTHROPLASTY: SHX124

## 2020-12-21 LAB — GLUCOSE, CAPILLARY
Glucose-Capillary: 119 mg/dL — ABNORMAL HIGH (ref 70–99)
Glucose-Capillary: 100 mg/dL — ABNORMAL HIGH (ref 70–99)
Glucose-Capillary: 114 mg/dL — ABNORMAL HIGH (ref 70–99)
Glucose-Capillary: 123 mg/dL — ABNORMAL HIGH (ref 70–99)
Glucose-Capillary: 292 mg/dL — ABNORMAL HIGH (ref 70–99)
Glucose-Capillary: 244 mg/dL — ABNORMAL HIGH (ref 70–99)

## 2020-12-21 LAB — CBC
HCT: 32.9 % — ABNORMAL LOW (ref 36.0–46.0)
HCT: 32.5 % — ABNORMAL LOW (ref 36.0–46.0)
Hemoglobin: 11 g/dL — ABNORMAL LOW (ref 12.0–15.0)
Hemoglobin: 10.8 g/dL — ABNORMAL LOW (ref 12.0–15.0)
MCH: 30.6 pg (ref 26.0–34.0)
MCH: 30.2 pg (ref 26.0–34.0)
MCHC: 33.4 g/dL (ref 30.0–36.0)
MCHC: 33.2 g/dL (ref 30.0–36.0)
MCV: 91.6 fL (ref 80.0–100.0)
MCV: 90.8 fL (ref 80.0–100.0)
Platelets: 197 10*3/uL (ref 150–400)
Platelets: 186 10*3/uL (ref 150–400)
RBC: 3.59 MIL/uL — ABNORMAL LOW (ref 3.87–5.11)
RBC: 3.58 MIL/uL — ABNORMAL LOW (ref 3.87–5.11)
RDW: 12.4 % (ref 11.5–15.5)
RDW: 12.4 % (ref 11.5–15.5)
WBC: 11.7 10*3/uL — ABNORMAL HIGH (ref 4.0–10.5)
WBC: 17.7 10*3/uL — ABNORMAL HIGH (ref 4.0–10.5)
nRBC: 0 % (ref 0.0–0.2)
nRBC: 0 % (ref 0.0–0.2)

## 2020-12-21 LAB — BASIC METABOLIC PANEL
Anion gap: 6 (ref 5–15)
BUN: 26 mg/dL — ABNORMAL HIGH (ref 8–23)
CO2: 25 mmol/L (ref 22–32)
Calcium: 8.8 mg/dL — ABNORMAL LOW (ref 8.9–10.3)
Chloride: 108 mmol/L (ref 98–111)
Creatinine, Ser: 1.33 mg/dL — ABNORMAL HIGH (ref 0.44–1.00)
GFR, Estimated: 38 mL/min — ABNORMAL LOW (ref >60)
Glucose, Bld: 153 mg/dL — ABNORMAL HIGH (ref 70–99)
Potassium: 3.7 mmol/L (ref 3.5–5.1)
Sodium: 139 mmol/L (ref 135–145)

## 2020-12-21 LAB — TYPE AND SCREEN
ABO/RH(D): O POS
Antibody Screen: NEGATIVE

## 2020-12-21 LAB — CREATININE, SERUM
Creatinine, Ser: 1.23 mg/dL — ABNORMAL HIGH (ref 0.44–1.00)
GFR, Estimated: 42 mL/min — ABNORMAL LOW (ref >60)

## 2020-12-21 SURGERY — ARTHROPLASTY, HIP, TOTAL, ANTERIOR APPROACH
Anesthesia: Spinal | Site: Hip | Laterality: Right

## 2020-12-21 MED ORDER — MAGNESIUM CITRATE PO SOLN: 1.0000 | Freq: Once | ORAL | Status: DC | PRN

## 2020-12-21 MED ORDER — FENTANYL CITRATE (PF) 250 MCG/5ML IJ SOLN
INTRAMUSCULAR | Status: AC
  Filled 2020-12-21: qty 5

## 2020-12-21 MED ORDER — BUPIVACAINE IN DEXTROSE 0.75-8.25 % IT SOLN
INTRATHECAL | Status: DC | PRN
  Administered 2020-12-21: 1.8 mL via INTRATHECAL

## 2020-12-21 MED ORDER — GLUCERNA SHAKE PO LIQD
237.0000 mL | Freq: Three times a day (TID) | ORAL | Status: DC
  Administered 2020-12-21 – 2020-12-27 (×16): 237 mL via ORAL
  Filled 2020-12-21: qty 237

## 2020-12-21 MED ORDER — SODIUM CHLORIDE 0.9 % IR SOLN
Status: DC | PRN
  Administered 2020-12-21: 1000 mL

## 2020-12-21 MED ORDER — HYDROCODONE-ACETAMINOPHEN 7.5-325 MG PO TABS: 1.0000 | ORAL_TABLET | Freq: Three times a day (TID) | ORAL | 0 refills | Status: AC | PRN

## 2020-12-21 MED ORDER — METHOCARBAMOL 500 MG PO TABS
500.0000 mg | ORAL_TABLET | Freq: Four times a day (QID) | ORAL | Status: DC | PRN
  Administered 2020-12-25 – 2020-12-26 (×2): 500 mg via ORAL
  Filled 2020-12-21 (×4): qty 1

## 2020-12-21 MED ORDER — ORAL CARE MOUTH RINSE: 15.0000 mL | Freq: Once | OROMUCOSAL | Status: AC

## 2020-12-21 MED ORDER — BUPIVACAINE-MELOXICAM ER 400-12 MG/14ML IJ SOLN
INTRAMUSCULAR | Status: AC
  Filled 2020-12-21: qty 1

## 2020-12-21 MED ORDER — TRANEXAMIC ACID-NACL 1000-0.7 MG/100ML-% IV SOLN
INTRAVENOUS | Status: AC
  Filled 2020-12-21: qty 100

## 2020-12-21 MED ORDER — PROSOURCE PLUS PO LIQD
30.0000 mL | Freq: Two times a day (BID) | ORAL | Status: DC
  Administered 2020-12-22 – 2020-12-27 (×9): 30 mL via ORAL
  Filled 2020-12-21 (×11): qty 30

## 2020-12-21 MED ORDER — OXYCODONE HCL 5 MG/5ML PO SOLN
5.0000 mg | Freq: Once | ORAL | Status: DC | PRN
Start: 2020-12-21 — End: 2020-12-21

## 2020-12-21 MED ORDER — OXYCODONE HCL 5 MG PO TABS: 10.0000 mg | ORAL_TABLET | ORAL | Status: DC | PRN

## 2020-12-21 MED ORDER — PROPOFOL 1000 MG/100ML IV EMUL
INTRAVENOUS | Status: AC
  Filled 2020-12-21: qty 100

## 2020-12-21 MED ORDER — IRRISEPT - 450ML BOTTLE WITH 0.05% CHG IN STERILE WATER, USP 99.95% OPTIME
TOPICAL | Status: DC | PRN
  Administered 2020-12-21: 450 mL

## 2020-12-21 MED ORDER — VANCOMYCIN HCL 1000 MG IV SOLR
INTRAVENOUS | Status: AC
  Filled 2020-12-21: qty 1000

## 2020-12-21 MED ORDER — PHENOL 1.4 % MT LIQD: 1.0000 | OROMUCOSAL | Status: DC | PRN

## 2020-12-21 MED ORDER — ACETAMINOPHEN 500 MG PO TABS
1000.0000 mg | ORAL_TABLET | Freq: Four times a day (QID) | ORAL | Status: AC
  Administered 2020-12-21 – 2020-12-22 (×4): 1000 mg via ORAL
  Filled 2020-12-21 (×4): qty 2

## 2020-12-21 MED ORDER — SODIUM CHLORIDE 0.9 % IV SOLN: INTRAVENOUS | Status: DC

## 2020-12-21 MED ORDER — ENOXAPARIN SODIUM 40 MG/0.4ML ~~LOC~~ SOLN: 40.0000 mg | Freq: Every day | SUBCUTANEOUS | 0 refills | Status: AC

## 2020-12-21 MED ORDER — DOCUSATE SODIUM 100 MG PO CAPS
100.0000 mg | ORAL_CAPSULE | Freq: Two times a day (BID) | ORAL | Status: DC
  Administered 2020-12-22 – 2020-12-27 (×12): 100 mg via ORAL
  Filled 2020-12-21 (×12): qty 1

## 2020-12-21 MED ORDER — PHENYLEPHRINE HCL-NACL 10-0.9 MG/250ML-% IV SOLN
INTRAVENOUS | Status: DC | PRN
  Administered 2020-12-21: 40 ug/min via INTRAVENOUS

## 2020-12-21 MED ORDER — OXYCODONE HCL 5 MG PO TABS: 5.0000 mg | ORAL_TABLET | Freq: Once | ORAL | Status: DC | PRN

## 2020-12-21 MED ORDER — ONDANSETRON HCL 4 MG PO TABS: 4.0000 mg | ORAL_TABLET | Freq: Four times a day (QID) | ORAL | Status: DC | PRN

## 2020-12-21 MED ORDER — ACETAMINOPHEN 325 MG PO TABS
650.0000 mg | ORAL_TABLET | Freq: Once | ORAL | Status: AC
  Administered 2020-12-21: 650 mg via ORAL
  Filled 2020-12-21: qty 2

## 2020-12-21 MED ORDER — CHLORHEXIDINE GLUCONATE 0.12 % MT SOLN: 15.0000 mL | Freq: Once | OROMUCOSAL | Status: AC

## 2020-12-21 MED ORDER — SORBITOL 70 % SOLN
30.0000 mL | Freq: Every day | Status: DC | PRN
  Filled 2020-12-21: qty 30

## 2020-12-21 MED ORDER — POLYETHYLENE GLYCOL 3350 17 G PO PACK: 17.0000 g | PACK | Freq: Every day | ORAL | Status: DC | PRN

## 2020-12-21 MED ORDER — ALUM & MAG HYDROXIDE-SIMETH 200-200-20 MG/5ML PO SUSP: 30.0000 mL | ORAL | Status: DC | PRN

## 2020-12-21 MED ORDER — FENTANYL CITRATE (PF) 250 MCG/5ML IJ SOLN
INTRAMUSCULAR | Status: DC | PRN
  Administered 2020-12-21: 50 ug via INTRAVENOUS

## 2020-12-21 MED ORDER — OXYCODONE HCL 5 MG PO TABS
5.0000 mg | ORAL_TABLET | ORAL | Status: DC | PRN
  Administered 2020-12-23 (×3): 5 mg via ORAL
  Administered 2020-12-26 – 2020-12-27 (×2): 10 mg via ORAL
  Filled 2020-12-21 (×2): qty 2
  Filled 2020-12-21 (×2): qty 1
  Filled 2020-12-21: qty 2
  Filled 2020-12-21: qty 1

## 2020-12-21 MED ORDER — METHOCARBAMOL 1000 MG/10ML IJ SOLN
500.0000 mg | Freq: Four times a day (QID) | INTRAMUSCULAR | Status: DC | PRN
  Filled 2020-12-21: qty 5

## 2020-12-21 MED ORDER — CHLORHEXIDINE GLUCONATE 0.12 % MT SOLN
OROMUCOSAL | Status: AC
  Administered 2020-12-21: 15 mL via OROMUCOSAL
  Filled 2020-12-21: qty 15

## 2020-12-21 MED ORDER — 0.9 % SODIUM CHLORIDE (POUR BTL) OPTIME
TOPICAL | Status: DC | PRN
  Administered 2020-12-21: 1000 mL

## 2020-12-21 MED ORDER — ACETAMINOPHEN 325 MG PO TABS
325.0000 mg | ORAL_TABLET | Freq: Four times a day (QID) | ORAL | Status: DC | PRN
  Administered 2020-12-22 – 2020-12-25 (×7): 650 mg via ORAL
  Filled 2020-12-21 (×7): qty 2

## 2020-12-21 MED ORDER — VANCOMYCIN HCL 1 G IV SOLR
INTRAVENOUS | Status: DC | PRN
  Administered 2020-12-21: 1000 mg

## 2020-12-21 MED ORDER — TRANEXAMIC ACID 1000 MG/10ML IV SOLN
INTRAVENOUS | Status: DC | PRN
  Administered 2020-12-21: 2000 mg via TOPICAL

## 2020-12-21 MED ORDER — CEFAZOLIN SODIUM-DEXTROSE 2-4 GM/100ML-% IV SOLN
2.0000 g | Freq: Two times a day (BID) | INTRAVENOUS | Status: AC
  Administered 2020-12-22 – 2020-12-23 (×3): 2 g via INTRAVENOUS
  Filled 2020-12-21 (×3): qty 100

## 2020-12-21 MED ORDER — TRANEXAMIC ACID-NACL 1000-0.7 MG/100ML-% IV SOLN
1000.0000 mg | Freq: Once | INTRAVENOUS | Status: AC
  Administered 2020-12-21: 1000 mg via INTRAVENOUS
  Filled 2020-12-21: qty 100

## 2020-12-21 MED ORDER — ADULT MULTIVITAMIN W/MINERALS CH
1.0000 | ORAL_TABLET | Freq: Every day | ORAL | Status: DC
  Administered 2020-12-21 – 2020-12-27 (×7): 1 via ORAL
  Filled 2020-12-21 (×7): qty 1

## 2020-12-21 MED ORDER — HYDROMORPHONE HCL 1 MG/ML IJ SOLN: 0.5000 mg | INTRAMUSCULAR | Status: DC | PRN

## 2020-12-21 MED ORDER — KETAMINE HCL 50 MG/5ML IJ SOSY
PREFILLED_SYRINGE | INTRAMUSCULAR | Status: AC
  Filled 2020-12-21: qty 5

## 2020-12-21 MED ORDER — KETAMINE HCL 10 MG/ML IJ SOLN
INTRAMUSCULAR | Status: DC | PRN
  Administered 2020-12-21: 20 mg via INTRAVENOUS

## 2020-12-21 MED ORDER — PROPOFOL 500 MG/50ML IV EMUL
INTRAVENOUS | Status: DC | PRN
  Administered 2020-12-21: 25 ug/kg/min via INTRAVENOUS

## 2020-12-21 MED ORDER — FENTANYL CITRATE (PF) 100 MCG/2ML IJ SOLN: 25.0000 ug | INTRAMUSCULAR | Status: DC | PRN

## 2020-12-21 MED ORDER — BUPIVACAINE-MELOXICAM ER 400-12 MG/14ML IJ SOLN
INTRAMUSCULAR | Status: DC | PRN
  Administered 2020-12-21: 400 mg

## 2020-12-21 MED ORDER — LACTATED RINGERS IV SOLN: INTRAVENOUS | Status: DC

## 2020-12-21 MED ORDER — ENOXAPARIN SODIUM 30 MG/0.3ML ~~LOC~~ SOLN
30.0000 mg | SUBCUTANEOUS | Status: DC
  Administered 2020-12-22 – 2020-12-27 (×6): 30 mg via SUBCUTANEOUS
  Filled 2020-12-21 (×6): qty 0.3

## 2020-12-21 MED ORDER — EPHEDRINE SULFATE-NACL 50-0.9 MG/10ML-% IV SOSY
PREFILLED_SYRINGE | INTRAVENOUS | Status: DC | PRN
  Administered 2020-12-21 (×2): 5 mg via INTRAVENOUS

## 2020-12-21 MED ORDER — ALBUTEROL SULFATE HFA 108 (90 BASE) MCG/ACT IN AERS
INHALATION_SPRAY | RESPIRATORY_TRACT | Status: AC
  Filled 2020-12-21: qty 6.7

## 2020-12-21 MED ORDER — ONDANSETRON HCL 4 MG/2ML IJ SOLN: 4.0000 mg | Freq: Four times a day (QID) | INTRAMUSCULAR | Status: DC | PRN

## 2020-12-21 MED ORDER — MENTHOL 3 MG MT LOZG: 1.0000 | LOZENGE | OROMUCOSAL | Status: DC | PRN

## 2020-12-21 MED ORDER — POLYETHYLENE GLYCOL 3350 17 G PO PACK
17.0000 g | PACK | Freq: Every day | ORAL | Status: DC
  Administered 2020-12-22 – 2020-12-27 (×6): 17 g via ORAL
  Filled 2020-12-21 (×6): qty 1

## 2020-12-21 MED ORDER — ONDANSETRON HCL 4 MG/2ML IJ SOLN: 4.0000 mg | Freq: Once | INTRAMUSCULAR | Status: DC | PRN

## 2020-12-21 SURGICAL SUPPLY — 65 items
ARTICULEZE HEAD (Hips) ×2 IMPLANT
BAG DECANTER FOR FLEXI CONT (MISCELLANEOUS) ×2 IMPLANT
CELLS DAT CNTRL 66122 CELL SVR (MISCELLANEOUS) IMPLANT
COVER PERINEAL POST (MISCELLANEOUS) ×2 IMPLANT
COVER SURGICAL LIGHT HANDLE (MISCELLANEOUS) ×2 IMPLANT
COVER WAND RF STERILE (DRAPES) ×2 IMPLANT
DERMABOND ADHESIVE PROPEN (GAUZE/BANDAGES/DRESSINGS) ×1
DERMABOND ADVANCED .7 DNX6 (GAUZE/BANDAGES/DRESSINGS) IMPLANT
DRAPE C-ARM 42X72 X-RAY (DRAPES) ×2 IMPLANT
DRAPE POUCH INSTRU U-SHP 10X18 (DRAPES) ×2 IMPLANT
DRAPE STERI IOBAN 125X83 (DRAPES) ×2 IMPLANT
DRAPE U-SHAPE 47X51 STRL (DRAPES) ×4 IMPLANT
DRSG AQUACEL AG ADV 3.5X 6 (GAUZE/BANDAGES/DRESSINGS) ×1 IMPLANT
DRSG AQUACEL AG ADV 3.5X10 (GAUZE/BANDAGES/DRESSINGS) ×2 IMPLANT
DURAPREP 26ML APPLICATOR (WOUND CARE) ×4 IMPLANT
ELECT BLADE 4.0 EZ CLEAN MEGAD (MISCELLANEOUS) ×2
ELECT REM PT RETURN 9FT ADLT (ELECTROSURGICAL) ×2
ELECTRODE BLDE 4.0 EZ CLN MEGD (MISCELLANEOUS) ×1 IMPLANT
ELECTRODE REM PT RTRN 9FT ADLT (ELECTROSURGICAL) ×1 IMPLANT
GLOVE ECLIPSE 7.0 STRL STRAW (GLOVE) ×4 IMPLANT
GLOVE SKINSENSE NS SZ7.5 (GLOVE) ×1
GLOVE SKINSENSE STRL SZ7.5 (GLOVE) ×1 IMPLANT
GLOVE SURG SYN 7.5  E (GLOVE) ×4
GLOVE SURG SYN 7.5 E (GLOVE) ×4 IMPLANT
GLOVE SURG SYN 7.5 PF PI (GLOVE) ×4 IMPLANT
GLOVE SURG UNDER POLY LF SZ7 (GLOVE) ×2 IMPLANT
GOWN STRL REIN XL XLG (GOWN DISPOSABLE) ×2 IMPLANT
GOWN STRL REUS W/ TWL LRG LVL3 (GOWN DISPOSABLE) IMPLANT
GOWN STRL REUS W/ TWL XL LVL3 (GOWN DISPOSABLE) ×1 IMPLANT
GOWN STRL REUS W/TWL LRG LVL3 (GOWN DISPOSABLE)
GOWN STRL REUS W/TWL XL LVL3 (GOWN DISPOSABLE) ×1
HANDPIECE INTERPULSE COAX TIP (DISPOSABLE) ×1
HEAD ARTICULEZE (Hips) IMPLANT
HOOD PEEL AWAY FLYTE STAYCOOL (MISCELLANEOUS) ×4 IMPLANT
IV NS IRRIG 3000ML ARTHROMATIC (IV SOLUTION) ×2 IMPLANT
JET LAVAGE IRRISEPT WOUND (IRRIGATION / IRRIGATOR) ×2
KIT BASIN OR (CUSTOM PROCEDURE TRAY) ×2 IMPLANT
LAVAGE JET IRRISEPT WOUND (IRRIGATION / IRRIGATOR) ×1 IMPLANT
LINER NEUTRAL 52X36MM PLUS 4 (Liner) ×1 IMPLANT
MARKER SKIN DUAL TIP RULER LAB (MISCELLANEOUS) ×2 IMPLANT
NDL SPNL 18GX3.5 QUINCKE PK (NEEDLE) ×1 IMPLANT
NEEDLE SPNL 18GX3.5 QUINCKE PK (NEEDLE) ×2 IMPLANT
PACK TOTAL JOINT (CUSTOM PROCEDURE TRAY) ×2 IMPLANT
PACK UNIVERSAL I (CUSTOM PROCEDURE TRAY) ×2 IMPLANT
PIN SECTOR W/GRIP ACE CUP 52MM (Hips) ×1 IMPLANT
RETRACTOR WND ALEXIS 18 MED (MISCELLANEOUS) IMPLANT
RTRCTR WOUND ALEXIS 18CM MED (MISCELLANEOUS)
SAW OSC TIP CART 19.5X105X1.3 (SAW) ×2 IMPLANT
SCREW 6.5MMX25MM (Screw) ×1 IMPLANT
SET HNDPC FAN SPRY TIP SCT (DISPOSABLE) ×1 IMPLANT
STAPLER VISISTAT 35W (STAPLE) IMPLANT
STEM FEM SZ3 STD ACTIS (Stem) ×1 IMPLANT
SUT ETHIBOND 2 V 37 (SUTURE) ×2 IMPLANT
SUT VIC AB 0 CT1 27 (SUTURE) ×1
SUT VIC AB 0 CT1 27XBRD ANBCTR (SUTURE) ×1 IMPLANT
SUT VIC AB 1 CTX 36 (SUTURE) ×1
SUT VIC AB 1 CTX36XBRD ANBCTR (SUTURE) ×1 IMPLANT
SUT VIC AB 2-0 CT1 27 (SUTURE) ×2
SUT VIC AB 2-0 CT1 TAPERPNT 27 (SUTURE) ×2 IMPLANT
SYR 50ML LL SCALE MARK (SYRINGE) ×2 IMPLANT
TOWEL GREEN STERILE (TOWEL DISPOSABLE) ×2 IMPLANT
TRAY CATH 16FR W/PLASTIC CATH (SET/KITS/TRAYS/PACK) IMPLANT
TRAY FOLEY W/BAG SLVR 16FR (SET/KITS/TRAYS/PACK) ×1
TRAY FOLEY W/BAG SLVR 16FR ST (SET/KITS/TRAYS/PACK) ×1 IMPLANT
YANKAUER SUCT BULB TIP NO VENT (SUCTIONS) ×2 IMPLANT

## 2020-12-21 NOTE — Discharge Instructions (Signed)

## 2020-12-21 NOTE — Anesthesia Postprocedure Evaluation (Signed)
Anesthesia Post Note  Patient: Patricia Green  Procedure(s) Performed: TOTAL HIP ARTHROPLASTY ANTERIOR APPROACH (Right Hip)     Patient location during evaluation: PACU Anesthesia Type: Spinal Level of consciousness: oriented, awake and alert and awake Pain management: pain level controlled Vital Signs Assessment: post-procedure vital signs reviewed and stable Respiratory status: spontaneous breathing, respiratory function stable, patient connected to nasal cannula oxygen and nonlabored ventilation Cardiovascular status: blood pressure returned to baseline and stable Postop Assessment: no headache, no backache and no apparent nausea or vomiting Anesthetic complications: no   No complications documented.  Last Vitals:  Vitals:   12/21/20 1530 12/21/20 1540  BP: (!) 137/55 (!) 147/55  Pulse: (!) 57 (!) 59  Resp: 12 17  Temp: (!) 36.1 C   SpO2: 100% 100%    Last Pain:  Vitals:   12/21/20 1540  TempSrc:   PainSc: 0-No pain                 Cecile Hearing

## 2020-12-21 NOTE — Progress Notes (Signed)
Initial Nutrition Assessment  DOCUMENTATION CODES:  Non-severe (moderate) malnutrition in context of chronic illness  INTERVENTION:  Advance diet as medically able. Recommend regular diet given good BG control and only slightly elevated HbA1c.  Once medically able, add Glucerna Shake po TID, each supplement provides 220 kcal and 10 grams of protein.  Once medically able, add 30 ml ProSource Plus po BID, each supplement provides 100 kcal and 15 grams of protein.   Add MVI with minerals daily.  Consider oral bowel regimen given no BM in 4 days.  NUTRITION DIAGNOSIS:  Moderate Malnutrition related to chronic illness,other (see comment) (T2DM, age) as evidenced by mild fat depletion,moderate fat depletion,mild muscle depletion,moderate muscle depletion,severe muscle depletion,edema.  GOAL:  Patient will meet greater than or equal to 90% of their needs  MONITOR:  Diet advancement,Skin,Weight trends,Labs  REASON FOR ASSESSMENT:  Consult Hip fracture protocol  ASSESSMENT:  87 yo female with a PMH of T2DM and L diabetic foot wound (sees podiatrist outpt) who presents with a R closed subcapital femur fracture.  Spoke with pt and son at bedside. Pt in good spirits. Son and pt reports that at home, she's a big eater. She likes to have rice with soup, soup on its own, ham sandwiches, eggs, and hamburgers. She reports having a good appetite and no appetite changes recently. Son reports that they monitor strictly what she eats because she is a "brittle diabetic."  She denies any weight loss recently. No weight history given. On exam, pt has moderate depletions in both fat and muscle. Some of the depletion is likely d/t age, but some is likely d/t not getting enough nutrition. Pt at risk for severe malnutrition.  Pt has not had a bowel movement in 4 days. Secure chatted MD to make aware.  Pt is NPO for surgery now. Recommend adding Glucerna, per pt request, TID and adding Prosource Plus BID to  increase caloric and protein intake. Also recommend MVI with minerals. Recommend regular diet once diet is advanced.   If PO intake does not meet needs, consider other supplements given good diabetes control.  Relevant Medications: reviewed Labs: reviewed; CBG 48-181 HbA1c: 6.0% (12/2020)  NUTRITION - FOCUSED PHYSICAL EXAM: Flowsheet Row Most Recent Value  Orbital Region Moderate depletion  Upper Arm Region Moderate depletion  Thoracic and Lumbar Region Mild depletion  Buccal Region Moderate depletion  Temple Region Moderate depletion  Clavicle Bone Region Severe depletion  Clavicle and Acromion Bone Region Moderate depletion  Scapular Bone Region Unable to assess  Dorsal Hand Moderate depletion  Patellar Region Moderate depletion  Anterior Thigh Region Mild depletion  Posterior Calf Region Mild depletion  Edema (RD Assessment) Mild  [RLE]  Hair Reviewed  Eyes Reviewed  Mouth Reviewed  Skin Reviewed  Nails Reviewed     Diet Order:   Diet Order            Diet NPO time specified  Diet effective midnight                EDUCATION NEEDS:  Education needs have been addressed  Skin:  Skin Assessment: Skin Integrity Issues: Skin Integrity Issues:: Diabetic Ulcer Diabetic Ulcer: L toe  Last BM:  12/17/20  Height:  Ht Readings from Last 1 Encounters:  12/21/20 5\' 5"  (1.651 m)   Weight:  Wt Readings from Last 1 Encounters:  12/21/20 52.6 kg   Ideal Body Weight:  56 kg  BMI:  Body mass index is 19.3 kg/m.  Estimated Nutritional Needs:  Kcal:  1750-1950 Protein:  80-95 grams Fluid:  >1.75 L  Vertell Limber, RD, LDN Registered Dietitian After Hours/Weekend Pager # in [place]

## 2020-12-21 NOTE — Consult Note (Signed)
WOC Nurse Consult Note: Patient receiving care in [facility] 5N02. Patient was seen by Gaetano Hawthorne on 12/17/20 for the left foot wound Reason for Consult: left foot diabetic foot ulcer Wound type:neuropathic wound. See Dr. Vara Guardian note and photo from that date. Pressure Injury POA: Yes/No/NA Measurement: Wound bed: pink Drainage (amount, consistency, odor) some drainage Periwound: callus Dressing procedure/placement/frequency:  Cleanse left lateral foot wound with saline. Place a small piece of Aquacel [place] [id number]) in the wound bed, then a small foam dressing. Change daily. WOC nurse will not follow at this time.  Please re-consult the WOC team if needed.  Helmut Muster, RN, MSN, CWOCN, CNS-BC, pager [phone]

## 2020-12-21 NOTE — Progress Notes (Signed)
Pts HS CBG 244mg /dl. Pt with order to notify physician for glucose >140 mg/dl. Amion message sent to X. Blount. Will continue to monitor

## 2020-12-21 NOTE — Progress Notes (Addendum)
PROGRESS NOTE   Patricia Green  FXT:[id number]    DOB: 1929-09-17    DOA: 12/20/2020  PCP: Soundra Pilon, FNP   I have briefly reviewed patients previous medical records in [facility].  Chief Complaint  Patient presents with  . Fall    Brief Narrative:  87 year old female, lives with her brother and his family, independent and functional at baseline, medical history significant for but not limited to type II DM, left diabetic foot wound (sees podiatry), possible dementia, who presented initially to [facility] ED on 12/20/2020 after she slipped and fell and noted immediate right hip pain.  She had been wearing a boot on her left foot prescribed by the podiatrist and the boot slipped prompting her fall.  Admitted for right hip fracture and DM with hypoglycemia.  Orthopedics was consulted, patient transferred to [facility] and currently in OR for right total hip arthroplasty.   Assessment & Plan:  Principal Problem:   Closed subcapital fracture of femur, right, initial encounter [facility]) Active Problems:   Type 2 diabetes mellitus with foot ulcer (HCC)   Hypoglycemia due to insulin   Malnutrition of moderate degree   Acute right hip fracture/right subcapital femoral neck fracture with displacement of fracture fragments  Sustained due to mechanical fall at home after her LLE boot slipped on the floor.  Suspect underlying osteoporosis.  Will need vitamin D levels and bone density checked in treated as needed as outpatient.  Orthopedics consulted and patiently currently in OR for right total hip arthroplasty.  Based on data available, patient is at mild to moderate risk for perioperative CV events due to advanced age, frail physical status but is medically optimized to proceed with indicated surgery with close perioperative monitoring.  Acute kidney injury complicating stage IIIa CKD  Presented with creatinine of 1.09.  No prior results to compare.  Creatinine has  gone up to 1.33.  Unclear etiology.  May be due to volume depletion from poor oral intake related to pain etc.  Brief and gentle IV fluids.  Follow BMP in a.m.  Avoid nephrotoxic's.  Type II DM with hypoglycemia  Hypoglycemia likely from taking insulin without significant oral intake.  Resolved with oral intake and D50 in ED.  A1c 6 indicates tight control in this very elderly female.  Discontinued SSI for now.  Monitor CBGs closely and reinitiate if needed.  Left diabetic foot wound  Outpatient follow-up with podiatry.  WOC input appreciated.  Continue dressing changes per recommendations.  Anemia, may be due to chronic disease  Hemoglobin 11.9 on admission.  No baseline available.  Stable.  Follow CBC postoperatively.  Reactive leukocytosis  Almost resolved.  Body mass index is 19.3 kg/m.  Nutritional Status Nutrition Problem: Moderate Malnutrition Etiology: chronic illness,other (see comment) (T2DM, age) Signs/Symptoms: mild fat depletion,moderate fat depletion,mild muscle depletion,moderate muscle depletion,severe muscle depletion,edema Interventions: MVI,Glucerna shake,Prostat    DVT prophylaxis: Sequential compression device to OR Start: 12/20/20 1337     Code Status: DNR Family Communication: Discussed in detail with patient's brother at bedside, updated care and answered all questions Disposition:  Status is: Inpatient  Remains inpatient appropriate because:Inpatient level of care appropriate due to severity of illness   Dispo: The patient is from: Home              Anticipated d/c is to: SNF              Patient currently is not medically stable to d/c.  Difficult to place patient No        Consultants:   Orthopedic  Procedures:   In OR for right total hip arthroplasty 4/8  Antimicrobials:    Anti-infectives (From admission, onward)   Start     Dose/Rate Route Frequency Ordered Stop   12/21/20 1200  ceFAZolin (ANCEF) IVPB 2g/100 mL  premix        2 g 200 mL/hr over 30 Minutes Intravenous On call to O.R. 12/20/20 1336 12/22/20 0559        Subjective:  Interviewed and examined this morning in the presence of her brother at bedside.  Seen prior to procedure.  Reports that they live in a two-level house.  Patient independent and functional, able to climb up and down stairs, no cardiac history, no history of chest pain, dyspnea, palpitations, dizziness or lightheadedness.  Objective:   Vitals:   12/20/20 2347 12/21/20 0434 12/21/20 0838 12/21/20 1130  BP: (!) 138/55 134/62 (!) 141/62   Pulse: 96 73 78   Resp: 19 19 17    Temp: 98 F (36.7 C) 98.3 F (36.8 C) 98.7 F (37.1 C)   TempSrc: Oral Oral Oral   SpO2: 97% 99% 100%   Weight:    52.6 kg  Height:    5\' 5"  (1.651 m)    General exam: Pleasant elderly female, moderately built and frail, lying comfortably propped up in bed without distress.  Edentulous.  Oral mucosa with borderline hydration. Respiratory system: Clear to auscultation. Respiratory effort normal. Cardiovascular system: S1 & S2 heard, RRR. No JVD, murmurs, rubs, gallops or clicks. No pedal edema. Gastrointestinal system: Abdomen is nondistended, soft and nontender. No organomegaly or masses felt. Normal bowel sounds heard. Central nervous system: Alert and oriented to self and partly to place.  Follows simple instructions. No focal neurological deficits. Extremities: Symmetric 5 x 5 power except right lower extremity movements restricted due to pain.  Right lower extremity slightly shortened and externally rotated.  Right fifth toe deformed and overlaps the fourth toe. Skin: No rashes, lesions or ulcers Psychiatry: Judgement and insight appear somewhat impaired. Mood & affect pleasant and appropriate.     Data Reviewed:   I have personally reviewed following labs and imaging studies   CBC: Recent Labs  Lab 12/20/20 1039 12/21/20 0259  WBC 17.4* 11.7*  HGB 11.9* 11.0*  HCT 35.8* 32.9*   MCV 90.4 91.6  PLT 237 197    Basic Metabolic Panel: Recent Labs  Lab 12/20/20 1039 12/21/20 0259  NA 142 139  K 4.0 3.7  CL 111 108  CO2 22 25  GLUCOSE 48* 153*  BUN 37* 26*  CREATININE 1.09* 1.33*  CALCIUM 9.1 8.8*    Liver Function Tests: No results for input(s): AST, ALT, ALKPHOS, BILITOT, PROT, ALBUMIN in the last 168 hours.  CBG: Recent Labs  Lab 12/20/20 2054 12/21/20 0837 12/21/20 1118  GLUCAP 181* 119* 100*    Microbiology Studies:   Recent Results (from the past 240 hour(s))  Resp Panel by RT-PCR (Flu A&B, Covid) Nasopharyngeal Swab     Status: None   Collection Time: 12/20/20 10:39 AM   Specimen: Nasopharyngeal Swab; Nasopharyngeal(NP) swabs in vial transport medium  Result Value Ref Range Status   SARS Coronavirus 2 by RT PCR NEGATIVE NEGATIVE Final    Comment: (NOTE) SARS-CoV-2 target nucleic acids are NOT DETECTED.  The SARS-CoV-2 RNA is generally detectable in upper respiratory specimens during the acute phase of infection. The lowest concentration of SARS-CoV-2 viral copies  this assay can detect is 138 copies/mL. A negative result does not preclude SARS-Cov-2 infection and should not be used as the sole basis for treatment or other patient management decisions. A negative result may occur with  improper specimen collection/handling, submission of specimen other than nasopharyngeal swab, presence of viral mutation(s) within the areas targeted by this assay, and inadequate number of viral copies(<138 copies/mL). A negative result must be combined with clinical observations, patient history, and epidemiological information. The expected result is Negative.  Fact Sheet for Patients:  BloggerCourse.com  Fact Sheet for Healthcare Providers:  SeriousBroker.it  This test is no t yet approved or cleared by the [place] FDA and  has been authorized for detection and/or diagnosis of SARS-CoV-2  by FDA under an Emergency Use Authorization (EUA). This EUA will remain  in effect (meaning this test can be used) for the duration of the COVID-19 declaration under Section 564(b)(1) of the Act, 21 U.S.C.section 360bbb-3(b)(1), unless the authorization is terminated  or revoked sooner.       Influenza A by PCR NEGATIVE NEGATIVE Final   Influenza B by PCR NEGATIVE NEGATIVE Final    Comment: (NOTE) The Xpert Xpress SARS-CoV-2/FLU/RSV plus assay is intended as an aid in the diagnosis of influenza from Nasopharyngeal swab specimens and should not be used as a sole basis for treatment. Nasal washings and aspirates are unacceptable for Xpert Xpress SARS-CoV-2/FLU/RSV testing.  Fact Sheet for Patients: BloggerCourse.com  Fact Sheet for Healthcare Providers: SeriousBroker.it  This test is not yet approved or cleared by the [place] FDA and has been authorized for detection and/or diagnosis of SARS-CoV-2 by FDA under an Emergency Use Authorization (EUA). This EUA will remain in effect (meaning this test can be used) for the duration of the COVID-19 declaration under Section 564(b)(1) of the Act, 21 U.S.C. section 360bbb-3(b)(1), unless the authorization is terminated or revoked.  Performed at [facility], [address], [place], [place] [address]   Surgical pcr screen     Status: None   Collection Time: 12/20/20  5:46 PM   Specimen: Nasal Mucosa; Nasal Swab  Result Value Ref Range Status   MRSA, PCR NEGATIVE NEGATIVE Final   Staphylococcus aureus NEGATIVE NEGATIVE Final    Comment: (NOTE) The Xpert SA Assay (FDA approved for NASAL specimens in patients 2 years of age and older), is one component of a comprehensive surveillance program. It is not intended to diagnose infection nor to guide or monitor treatment. Performed at [facility], [address], [place], [place] [address]       Radiology Studies:  DG Chest 1 View  Result Date: 12/20/2020 CLINICAL DATA:  Fall with right hip pain. EXAM: CHEST  1 VIEW COMPARISON:  None. FINDINGS: Cardiac silhouette is normal in size and configuration. Normal mediastinal and hilar contours. Lungs are hyperexpanded, but clear. No pleural effusion or pneumothorax. Skeletal structures are grossly intact. IMPRESSION: No acute cardiopulmonary disease. Electronically Signed   By: Amie Portland M.D.   On: 12/20/2020 11:37   DG HIP UNILAT W OR W/O PELVIS 2-3 VIEWS RIGHT  Result Date: 12/20/2020 CLINICAL DATA:  Pain following fall EXAM: DG HIP (WITH OR WITHOUT PELVIS) 2-3V RIGHT COMPARISON:  None. FINDINGS: Frontal pelvis as well as frontal and lateral right hip images were obtained. There is a subcapital femoral neck fracture on the right. There is superior and lateral displacement of the femoral neck and shaft with respect to the femoral head. No other fracture. No  dislocation. Moderate symmetric narrowing noted each hip joint. IMPRESSION: Subcapital femoral neck fracture on the right with displacement of fracture fragments. No other fracture. No dislocation. Symmetric moderate narrowing of each hip joint noted. Electronically Signed   By: Bretta BangWilliam  Woodruff III M.D.   On: 12/20/2020 11:37     Scheduled Meds:   . (feeding supplement) PROSource Plus  30 mL Oral BID BM  . chlorhexidine  15 mL Mouth/Throat Once   Or  . mouth rinse  15 mL Mouth Rinse Once  . chlorhexidine      . feeding supplement (GLUCERNA SHAKE)  237 mL Oral TID BM  . multivitamin with minerals  1 tablet Oral Daily  . povidone-iodine  2 application Topical Once  . tranexamic acid (CYKLOKAPRON) topical - INTRAOP  2,000 mg Topical To OR    Continuous Infusions:   . sodium chloride 50 mL/hr at 12/21/20 0751  .  ceFAZolin (ANCEF) IV    . lactated ringers    . tranexamic acid       LOS: 1 day     Marcellus ScottAnand Braylee Lal, MD, [place]ontFACP, Arrowhead Regional Medical CenterFHM. [facility]    To contact  the attending provider between 7A-7P or the covering provider during after hours 7P-7A, please log into the web site [email or web address] and access using universal [place] password for that web site. If you do not have the password, please call the hospital operator.  12/21/2020, 12:08 PM

## 2020-12-21 NOTE — Transfer of Care (Signed)
Immediate Anesthesia Transfer of Care Note  Patient: Patricia Green  Procedure(s) Performed: TOTAL HIP ARTHROPLASTY ANTERIOR APPROACH (Right Hip)  Patient Location: PACU  Anesthesia Type:Spinal  Level of Consciousness: awake, alert  and oriented  Airway & Oxygen Therapy: Patient Spontanous Breathing  Post-op Assessment: Report given to RN and Post -op Vital signs reviewed and stable  Post vital signs: Reviewed and stable   Last Vitals:  Vitals Value Taken Time  BP    Temp    Pulse    Resp    SpO2      Last Pain:  Vitals:   12/21/20 1205  TempSrc:   PainSc: 0-No pain      Patients Stated Pain Goal: 3 (12/21/20 1205)  Complications: No complications documented.

## 2020-12-21 NOTE — Op Note (Addendum)
TOTAL HIP ARTHROPLASTY ANTERIOR APPROACH  Procedure Note Patricia Green   [id number]  Pre-op Diagnosis: Right subcapital femoral neck fracture     Post-op Diagnosis: same   Operative Procedures  1. Total hip replacement; Right hip; uncemented [id number]   Surgeon: Gershon Mussel, M.D.  Assist: Oneal Grout, PA-C   Anesthesia: spinal  Prosthesis: Depuy Acetabulum: Pinnacle 52 mm Femur: Actis 3 STD Head: 36 mm size: +5 Liner: +4  Bearing Type: metal/poly  Total Hip Arthroplasty (Anterior Approach) Op Note:  After informed consent was obtained and the operative extremity marked in the holding area, the patient was brought back to the operating room and placed supine on the HANA table. Next, the operative extremity was prepped and draped in normal sterile fashion. Surgical timeout occurred verifying patient identification, surgical site, surgical procedure and administration of antibiotics.  A bikini type incision was made in the groin region distal to the ASIS in line with Langer's lines and then a modified anterior Smith-Peterson approach to the hip was performed, using the interval between tensor fascia lata and sartorius.  Dissection was carried bluntly down onto the anterior hip capsule. The lateral femoral circumflex vessels were identified and coagulated. A capsulotomy was performed and there was return of fracture hematoma and the capsular flaps tagged for later repair.  The neck osteotomy was performed below the fracture. The femoral head was removed which showed moderate degenerative wear, the acetabular rim was cleared of soft tissue and attention was turned to reaming the acetabulum.  Sequential reaming was performed under fluoroscopic guidance. We reamed to a size 51 mm, and then impacted the acetabular shell. A 25 mm cancellous screw was placed through the shell for added fixation.  The liner was then placed after irrigation and attention turned to the femur.  After placing  the femoral hook, the leg was taken to externally rotated, extended and adducted position taking care to perform soft tissue releases to allow for adequate mobilization of the femur. Soft tissue was cleared from the shoulder of the greater trochanter and the hook elevator used to improve exposure of the proximal femur. Sequential broaching performed up to a size 3. Trial neck and head were placed. The leg was brought back up to neutral and the construct reduced.  Antibiotic irrigation was placed in the surgical wound and kept for at least 1 minute.  The position and sizing of components, offset and leg lengths were checked using fluoroscopy. Stability of the construct was checked in extension and external rotation without any subluxation or impingement of prosthesis. We dislocated the prosthesis, dropped the leg back into position, removed trial components, and irrigated copiously. The final stem and head was then placed, the leg brought back up, the system reduced and fluoroscopy used to verify positioning.  We irrigated, obtained hemostasis and closed the capsule using #2 ethibond suture.  One gram of vancomycin powder was placed in the surgical bed.   One gram of topical tranexamic acid was injected into the joint.  The fascia was closed with #1 vicryl plus, the deep fat layer was closed with 0 vicryl, the subcutaneous layers closed with 2.0 Vicryl Plus and the skin closed with 2.0 nylon and dermabond. A sterile dressing was applied. The patient was awakened in the operating room and taken to recovery in stable condition.  All sponge, needle, and instrument counts were correct at the end of the case.   Tessa Lerner, my PA, was a medical necessity for opening, closing, limb positioning,  retracting, exposing, and overall facilitation and timely completion of the surgery.  Position: supine  Complications: see description of procedure.  Time Out: performed   Drains/Packing: none  Estimated blood loss:  see anesthesia record  Returned to Recovery Room: in good condition.   Antibiotics: yes   Mechanical VTE (DVT) Prophylaxis: sequential compression devices, TED thigh-high  Chemical VTE (DVT) Prophylaxis: lovenox   Fluid Replacement: see anesthesia record  Specimens Removed: 1 to pathology   Sponge and Instrument Count Correct? yes   PACU: portable radiograph - low AP   Plan/RTC: Return in 2 weeks for staple removal. Weight Bearing/Load Lower Extremity: full  Hip precautions: none Suture Removal: 2 weeks   N. Glee Arvin, MD Patricia Green 2:08 PM   Implant Name Type Inv. Item Serial No. Manufacturer Lot No. LRB No. Used Action  PIN SECTOR W/GRIP ACE CUP - [id number] Hips PIN SECTOR W/GRIP ACE CUP  [employer] [id number] Right 1 Implanted  LINER NEUTRAL 52X36MM PLUS 4 - [id number] Liner LINER NEUTRAL 52X36MM PLUS 4  [employer] [id number] Right 1 Implanted  SCREW 6.5MMX25MM - [id number] Screw SCREW 6.5MMX25MM  [employer] [id number] Right 1 Implanted  STEM FEM SZ3 STD ACTIS - [id number] Stem STEM FEM SZ3 STD ACTIS  [employer] [id number] Right 1 Implanted  ARTICULEZE HEAD - [id number] Hips ARTICULEZE HEAD  [employer] [id number] Right 1 Implanted

## 2020-12-21 NOTE — H&P (Signed)

## 2020-12-21 NOTE — Plan of Care (Signed)

## 2020-12-21 NOTE — Anesthesia Preprocedure Evaluation (Addendum)
Anesthesia Evaluation  Patient identified by MRN, date of birth, ID band Patient awake    Reviewed: Allergy & Precautions, NPO status , Patient's Chart, lab work & pertinent test results  Airway Mallampati: I  TM Distance: >3 FB Neck ROM: Full    Dental no notable dental hx. (+) Dental Advisory Given   Pulmonary neg pulmonary ROS, former smoker,    Pulmonary exam normal breath sounds clear to auscultation       Cardiovascular negative cardio ROS Normal cardiovascular exam Rhythm:Regular Rate:Normal     Neuro/Psych negative neurological ROS  negative psych ROS   GI/Hepatic negative GI ROS, Neg liver ROS,   Endo/Other  diabetes, Well Controlled, Type 2, Insulin DependentFS 100 in preop  Renal/GU Renal InsufficiencyRenal diseaseCr 1.33  negative genitourinary   Musculoskeletal Right hip fx   Abdominal   Peds  Hematology  (+) Blood dyscrasia, anemia , 11/32.9, plt 197 Last dose SQ heparin 2100 last night   Anesthesia Other Findings   Reproductive/Obstetrics negative OB ROS                            Anesthesia Physical Anesthesia Plan  ASA: II  Anesthesia Plan: Spinal and MAC   Post-op Pain Management:    Induction:   PONV Risk Score and Plan: 2 and Propofol infusion and TIVA  Airway Management Planned: Natural Airway and Nasal Cannula  Additional Equipment: None  Intra-op Plan:   Post-operative Plan:   Informed Consent: I have reviewed the patients History and Physical, chart, labs and discussed the procedure including the risks, benefits and alternatives for the proposed anesthesia with the patient or authorized representative who has indicated his/her understanding and acceptance.   Patient has DNR.  Discussed DNR with patient, Discussed DNR with power of attorney and Suspend DNR.     Plan Discussed with: CRNA  Anesthesia Plan Comments:        Anesthesia Quick  Evaluation

## 2020-12-21 NOTE — Anesthesia Procedure Notes (Addendum)
Spinal  Patient location during procedure: OR Start time: 12/21/2020 12:56 PM End time: 12/21/2020 1:00 PM Reason for block: surgical anesthesia Staffing Performed: anesthesiologist  Anesthesiologist: Lannie Fields, DO Preanesthetic Checklist Completed: patient identified, IV checked, risks and benefits discussed, surgical consent, monitors and equipment checked, pre-op evaluation and timeout performed Spinal Block Patient position: right lateral decubitus Prep: DuraPrep and site prepped and draped Patient monitoring: cardiac monitor, continuous pulse ox and blood pressure Approach: midline Location: L3-4 Injection technique: single-shot Needle Needle type: Pencan  Needle gauge: 24 G Needle length: 9 cm Assessment Sensory level: T6 Events: CSF return Additional Notes Functioning IV was confirmed and monitors were applied. Sterile prep and drape, including hand hygiene and sterile gloves were used. The patient was positioned and the spine was prepped. The skin was anesthetized with lidocaine.  Free flow of clear CSF was obtained prior to injecting local anesthetic into the CSF.  The spinal needle aspirated freely following injection.  The needle was carefully withdrawn.  The patient tolerated the procedure well.

## 2020-12-22 LAB — CBC
HCT: 30.1 % — ABNORMAL LOW (ref 36.0–46.0)
Hemoglobin: 10.1 g/dL — ABNORMAL LOW (ref 12.0–15.0)
MCH: 30.7 pg (ref 26.0–34.0)
MCHC: 33.6 g/dL (ref 30.0–36.0)
MCV: 91.5 fL (ref 80.0–100.0)
Platelets: 162 10*3/uL (ref 150–400)
RBC: 3.29 MIL/uL — ABNORMAL LOW (ref 3.87–5.11)
RDW: 12.4 % (ref 11.5–15.5)
WBC: 13.3 10*3/uL — ABNORMAL HIGH (ref 4.0–10.5)
nRBC: 0 % (ref 0.0–0.2)

## 2020-12-22 LAB — BASIC METABOLIC PANEL
Anion gap: 5 (ref 5–15)
BUN: 20 mg/dL (ref 8–23)
CO2: 26 mmol/L (ref 22–32)
Calcium: 8.7 mg/dL — ABNORMAL LOW (ref 8.9–10.3)
Chloride: 104 mmol/L (ref 98–111)
Creatinine, Ser: 1.33 mg/dL — ABNORMAL HIGH (ref 0.44–1.00)
GFR, Estimated: 38 mL/min — ABNORMAL LOW (ref >60)
Glucose, Bld: 196 mg/dL — ABNORMAL HIGH (ref 70–99)
Potassium: 4.1 mmol/L (ref 3.5–5.1)
Sodium: 135 mmol/L (ref 135–145)

## 2020-12-22 LAB — GLUCOSE, CAPILLARY
Glucose-Capillary: 188 mg/dL — ABNORMAL HIGH (ref 70–99)
Glucose-Capillary: 273 mg/dL — ABNORMAL HIGH (ref 70–99)
Glucose-Capillary: 209 mg/dL — ABNORMAL HIGH (ref 70–99)
Glucose-Capillary: 104 mg/dL — ABNORMAL HIGH (ref 70–99)

## 2020-12-22 MED ORDER — ASPIRIN 81 MG PO CHEW
81.0000 mg | CHEWABLE_TABLET | Freq: Every day | ORAL | Status: DC
  Administered 2020-12-22 – 2020-12-26 (×5): 81 mg via ORAL
  Filled 2020-12-22 (×5): qty 1

## 2020-12-22 MED ORDER — INSULIN ASPART 100 UNIT/ML ~~LOC~~ SOLN: 0.0000 [IU] | Freq: Every day | SUBCUTANEOUS | Status: DC

## 2020-12-22 MED ORDER — HYDROMORPHONE HCL 1 MG/ML IJ SOLN
INTRAMUSCULAR | Status: AC
  Filled 2020-12-22: qty 0.5

## 2020-12-22 MED ORDER — INSULIN DETEMIR 100 UNIT/ML ~~LOC~~ SOLN
10.0000 [IU] | Freq: Two times a day (BID) | SUBCUTANEOUS | Status: DC
  Administered 2020-12-22 (×2): 10 [IU] via SUBCUTANEOUS
  Filled 2020-12-22 (×4): qty 0.1

## 2020-12-22 MED ORDER — PROPOFOL 10 MG/ML IV BOLUS
INTRAVENOUS | Status: AC
  Filled 2020-12-22: qty 20

## 2020-12-22 MED ORDER — INSULIN ASPART 100 UNIT/ML ~~LOC~~ SOLN
0.0000 [IU] | Freq: Three times a day (TID) | SUBCUTANEOUS | Status: DC
  Administered 2020-12-22 – 2020-12-23 (×2): 3 [IU] via SUBCUTANEOUS
  Administered 2020-12-23: 2 [IU] via SUBCUTANEOUS
  Administered 2020-12-24: 3 [IU] via SUBCUTANEOUS
  Administered 2020-12-24 (×2): 2 [IU] via SUBCUTANEOUS
  Administered 2020-12-25: 1 [IU] via SUBCUTANEOUS
  Administered 2020-12-25: 5 [IU] via SUBCUTANEOUS
  Administered 2020-12-25: 3 [IU] via SUBCUTANEOUS

## 2020-12-22 MED ORDER — SODIUM CHLORIDE 0.9 % IV SOLN: INTRAVENOUS | Status: DC

## 2020-12-22 NOTE — Progress Notes (Signed)
PROGRESS NOTE   Patricia Green  ZOX:[id number]    DOB: 1929-09-21    DOA: 12/20/2020  PCP: Soundra Pilon, FNP   I have briefly reviewed patients previous medical records in [facility].  Chief Complaint  Patient presents with  . Fall    Brief Narrative:  87 year old female, lives with her brother and his family, independent and functional at baseline, medical history significant for but not limited to type II DM, left diabetic foot wound (sees podiatry), possible dementia, who presented initially to [facility] ED on 12/20/2020 after she slipped and fell and noted immediate right hip pain.  She had been wearing a boot on her left foot prescribed by the podiatrist and the boot slipped prompting her fall.  Admitted for right hip fracture and DM with hypoglycemia.  Orthopedics was consulted, patient transferred to [facility] and s/p right total hip arthroplasty 4/8.   Assessment & Plan:  Principal Problem:   Closed subcapital fracture of femur, right, initial encounter [facility]) Active Problems:   Type 2 diabetes mellitus with foot ulcer (HCC)   Hypoglycemia due to insulin   Malnutrition of moderate degree   Acute right hip fracture/right subcapital femoral neck fracture with displacement of fracture fragments, s/p right total hip arthroplasty 4/8 with  Sustained due to mechanical fall at home after her LLE boot slipped on the floor.  Suspect underlying osteoporosis.  Will need vitamin D levels and bone density checked in treated as needed as outpatient.  Orthopedics consulted and patiently underwent right total hip arthroplasty 4/8.  Multimodality pain control, DVT prophylaxis, weightbearing as per orthopedic team.  PT evaluated and recommend SNF.  Acute kidney injury complicating stage IIIa CKD  Presented with creatinine of 1.09.  No prior results to compare.  Creatinine has gone up to 1.33 >1.23 > 1.33  Unclear etiology.  May be due to volume depletion from  poor oral intake related to pain etc.  Continue IV fluids for another 24 hours.  Follow BMP in a.m.  Avoid nephrotoxic's.  Type II DM with hypoglycemia/hyperglycemia  Hypoglycemia on admission likely from taking insulin without significant oral intake.  Resolved with oral intake and D50 in ED.  A1c 6 indicates tight control in this very elderly female.  Now hyperglycemic.  Resumed Levemir at reduced dose and SSI.  Monitor closely  Left diabetic foot wound  Outpatient follow-up with podiatry.  WOC input appreciated.  Continue dressing changes per recommendations.  Anemia, may be due to chronic disease  Hemoglobin 11.9 on admission.  No baseline available.  Stable.  Continue to trend CBCs.  Reactive leukocytosis  Mild.  No infectious etiology suspected.  Follow CBC  Body mass index is 19.3 kg/m.  Nutritional Status Nutrition Problem: Moderate Malnutrition Etiology: chronic illness,other (see comment) (T2DM, age) Signs/Symptoms: mild fat depletion,moderate fat depletion,mild muscle depletion,moderate muscle depletion,severe muscle depletion,edema Interventions: MVI,Glucerna shake,Prostat    DVT prophylaxis: enoxaparin (LOVENOX) injection 30 mg Start: 12/22/20 0800 SCDs Start: 12/21/20 1614     Code Status: DNR Family Communication: Discussed in detail with patient's brother at bedside on 4/8, updated care and answered all questions.  None at bedside today Disposition:  Status is: Inpatient  Remains inpatient appropriate because:Inpatient level of care appropriate due to severity of illness   Dispo: The patient is from: Home              Anticipated d/c is to: SNF  Patient currently is not medically stable to d/c.   Difficult to place patient No        Consultants:   Orthopedic  Procedures:   S/p right total hip arthroplasty 12/30/22  Antimicrobials:    Anti-infectives (From admission, onward)   Start     Dose/Rate Route Frequency Ordered Stop    12/22/20 0100  ceFAZolin (ANCEF) IVPB 2g/100 mL premix        2 g 200 mL/hr over 30 Minutes Intravenous Every 12 hours 2020/12/29 1613 12/23/20 1259   2020/12/29 1339  vancomycin (VANCOCIN) powder  Status:  Discontinued          As needed 29-Dec-2020 1339 12-29-2020 1439   December 29, 2020 1200  ceFAZolin (ANCEF) IVPB 2g/100 mL premix        2 g 200 mL/hr over 30 Minutes Intravenous On call to O.R. 12/20/20 1336 12/29/20 1320        Subjective:  Patient seen and examined along with her to female nurses at bedside.  Up in chair.  Expected mild to moderate right hip pain.  Foley catheter out and has urinated.  Asking why she is not getting her insulins.  Wants to know when she can walk.  Objective:   Vitals:   Dec 29, 2020 2017 12/22/20 0026 12/22/20 0400 12/22/20 0819  BP: (!) 122/45 (!) 149/77 (!) 121/47 (!) 109/47  Pulse: 99 90 93 87  Resp: 16 16 15 14   Temp: 98.8 F (37.1 C) 98 F (36.7 C) 98.6 F (37 C) 98.4 F (36.9 C)  TempSrc:  Oral Oral Oral  SpO2: 98% 100% 99% 97%  Weight:      Height:        General exam: Pleasant elderly female, moderately built and frail, sitting up comfortably in reclining chair without distress. Respiratory system: Clear to auscultation.  No increased work of breathing. Cardiovascular system: S1 & S2 heard, RRR. No JVD, murmurs, rubs, gallops or clicks. No pedal edema. Gastrointestinal system: Abdomen is nondistended, soft and nontender. No organomegaly or masses felt. Normal bowel sounds heard. Central nervous system: Alert and oriented to self and partly to place.  Follows simple instructions. No focal neurological deficits. Extremities: Symmetric 5 x 5 power.  Right hip surgical site dressing clean and dry Skin: No rashes, lesions or ulcers Psychiatry: Judgement and insight appear somewhat impaired. Mood & affect pleasant and appropriate.     Data Reviewed:   I have personally reviewed following labs and imaging studies   CBC: Recent Labs  Lab  12/29/20 0259 2020/12/29 1728 12/22/20 0122  WBC 11.7* 17.7* 13.3*  HGB 11.0* 10.8* 10.1*  HCT 32.9* 32.5* 30.1*  MCV 91.6 90.8 91.5  PLT 197 186 162    Basic Metabolic Panel: Recent Labs  Lab 12/20/20 1039 2020-12-29 0259 December 29, 2020 1728 12/22/20 0122  NA 142 139  --  135  K 4.0 3.7  --  4.1  CL 111 108  --  104  CO2 22 25  --  26  GLUCOSE 48* 153*  --  196*  BUN 37* 26*  --  20  CREATININE 1.09* 1.33* 1.23* 1.33*  CALCIUM 9.1 8.8*  --  8.7*    Liver Function Tests: No results for input(s): AST, ALT, ALKPHOS, BILITOT, PROT, ALBUMIN in the last 168 hours.  CBG: Recent Labs  Lab 2020-12-29 2236 12/22/20 0638 12/22/20 1136  GLUCAP 244* 188* 273*    Microbiology Studies:   Recent Results (from the past 240 hour(s))  Resp Panel by RT-PCR (  Flu A&B, Covid) Nasopharyngeal Swab     Status: None   Collection Time: 12/20/20 10:39 AM   Specimen: Nasopharyngeal Swab; Nasopharyngeal(NP) swabs in vial transport medium  Result Value Ref Range Status   SARS Coronavirus 2 by RT PCR NEGATIVE NEGATIVE Final    Comment: (NOTE) SARS-CoV-2 target nucleic acids are NOT DETECTED.  The SARS-CoV-2 RNA is generally detectable in upper respiratory specimens during the acute phase of infection. The lowest concentration of SARS-CoV-2 viral copies this assay can detect is 138 copies/mL. A negative result does not preclude SARS-Cov-2 infection and should not be used as the sole basis for treatment or other patient management decisions. A negative result may occur with  improper specimen collection/handling, submission of specimen other than nasopharyngeal swab, presence of viral mutation(s) within the areas targeted by this assay, and inadequate number of viral copies(<138 copies/mL). A negative result must be combined with clinical observations, patient history, and epidemiological information. The expected result is Negative.  Fact Sheet for Patients:   BloggerCourse.comhttps://www.fda.gov/media/152166/download  Fact Sheet for Healthcare Providers:  SeriousBroker.ithttps://www.fda.gov/media/152162/download  This test is no t yet approved or cleared by the [place] FDA and  has been authorized for detection and/or diagnosis of SARS-CoV-2 by FDA under an Emergency Use Authorization (EUA). This EUA will remain  in effect (meaning this test can be used) for the duration of the COVID-19 declaration under Section 564(b)(1) of the Act, 21 U.S.C.section 360bbb-3(b)(1), unless the authorization is terminated  or revoked sooner.       Influenza A by PCR NEGATIVE NEGATIVE Final   Influenza B by PCR NEGATIVE NEGATIVE Final    Comment: (NOTE) The Xpert Xpress SARS-CoV-2/FLU/RSV plus assay is intended as an aid in the diagnosis of influenza from Nasopharyngeal swab specimens and should not be used as a sole basis for treatment. Nasal washings and aspirates are unacceptable for Xpert Xpress SARS-CoV-2/FLU/RSV testing.  Fact Sheet for Patients: BloggerCourse.comhttps://www.fda.gov/media/152166/download  Fact Sheet for Healthcare Providers: SeriousBroker.ithttps://www.fda.gov/media/152162/download  This test is not yet approved or cleared by the [place] FDA and has been authorized for detection and/or diagnosis of SARS-CoV-2 by FDA under an Emergency Use Authorization (EUA). This EUA will remain in effect (meaning this test can be used) for the duration of the COVID-19 declaration under Section 564(b)(1) of the Act, 21 U.S.C. section 360bbb-3(b)(1), unless the authorization is terminated or revoked.  Performed at [facility], [address] [place], [place] [address]   Surgical pcr screen     Status: None   Collection Time: 12/20/20  5:46 PM   Specimen: Nasal Mucosa; Nasal Swab  Result Value Ref Range Status   MRSA, PCR NEGATIVE NEGATIVE Final   Staphylococcus aureus NEGATIVE NEGATIVE Final    Comment: (NOTE) The Xpert SA Assay (FDA approved for NASAL specimens in  patients 85 years of age and older), is one component of a comprehensive surveillance program. It is not intended to diagnose infection nor to guide or monitor treatment. Performed at [facility], [address] [place], [place] [address]      Radiology Studies:  DG Pelvis Portable  Result Date: 12/21/2020 CLINICAL DATA:  Fracture. EXAM: PORTABLE PELVIS 1-2 VIEWS COMPARISON:  X-ray C-arm 12/21/2020, x-ray hip 12/20/2020 FINDINGS: Status post open reduction and internal fixation of the right hip with total right hip arthroplasty. There is no evidence of new acute pelvic fracture or diastasis. No pelvic bone lesions are seen. Subcutaneus soft tissue edema and emphysema of the right hip consistent with  postsurgical changes. IMPRESSION: Status post total right hip arthroplasty. No radiographic findings of surgical complication. Electronically Signed   By: Tish Frederickson M.D.   On: 12/21/2020 23:31   DG C-Arm 1-60 Min  Result Date: 12/21/2020 CLINICAL DATA:  Total hip arthroplasty. EXAM: OPERATIVE RIGHT HIP (WITH PELVIS IF PERFORMED) TECHNIQUE: Fluoroscopic spot image(s) were submitted for interpretation post-operatively. COMPARISON:  Preoperative radiograph 12/20/2020 FINDINGS: Two fluoroscopic spot views of the right hip in frontal projection demonstrate total hip arthroplasty in expected alignment. Total fluoroscopy in 20.1 seconds. Total dose 1.2373 mGy. IMPRESSION: Procedural fluoroscopy for right hip arthroplasty. Electronically Signed   By: Narda Rutherford M.D.   On: 12/21/2020 15:11   DG HIP OPERATIVE UNILAT W OR W/O PELVIS RIGHT  Result Date: 12/21/2020 CLINICAL DATA:  Total hip arthroplasty. EXAM: OPERATIVE RIGHT HIP (WITH PELVIS IF PERFORMED) TECHNIQUE: Fluoroscopic spot image(s) were submitted for interpretation post-operatively. COMPARISON:  Preoperative radiograph 12/20/2020 FINDINGS: Two fluoroscopic spot views of the right hip in frontal projection demonstrate total hip  arthroplasty in expected alignment. Total fluoroscopy in 20.1 seconds. Total dose 1.2373 mGy. IMPRESSION: Procedural fluoroscopy for right hip arthroplasty. Electronically Signed   By: Narda Rutherford M.D.   On: 12/21/2020 15:11     Scheduled Meds:   . (feeding supplement) PROSource Plus  30 mL Oral BID BM  . docusate sodium  100 mg Oral BID  . enoxaparin (LOVENOX) injection  30 mg Subcutaneous Q24H  . feeding supplement (GLUCERNA SHAKE)  237 mL Oral TID BM  . insulin aspart  0-5 Units Subcutaneous QHS  . insulin aspart  0-9 Units Subcutaneous TID WC  . insulin detemir  10 Units Subcutaneous BID  . multivitamin with minerals  1 tablet Oral Daily  . polyethylene glycol  17 g Oral Daily    Continuous Infusions:   . sodium chloride 75 mL/hr at 12/22/20 0829  .  ceFAZolin (ANCEF) IV 2 g (12/22/20 1247)  . methocarbamol (ROBAXIN) IV       LOS: 2 days     Marcellus Scott, MD, [place], [facility]. [facility]    To contact the attending provider between 7A-7P or the covering provider during after hours 7P-7A, please log into the web site [email or web address] and access using universal [place] password for that web site. If you do not have the password, please call the hospital operator.  12/22/2020, 2:37 PM

## 2020-12-22 NOTE — Plan of Care (Signed)
  Problem: Education: Goal: Knowledge of General Education information will improve Description: Including pain rating scale, medication(s)/side effects and non-pharmacologic comfort measures Outcome: Progressing   Problem: Activity: Goal: Risk for activity intolerance will decrease Outcome: Progressing   Problem: Nutrition: Goal: Adequate nutrition will be maintained Outcome: Progressing   Problem: Elimination: Goal: Will not experience complications related to urinary retention Outcome: Progressing   Problem: Pain Managment: Goal: General experience of comfort will improve Outcome: Progressing   

## 2020-12-22 NOTE — Evaluation (Signed)
Physical Therapy Evaluation Patient Details Name: Patricia Green MRN: [id number] DOB: October 03, 1929 Today's Date: 12/22/2020   History of Present Illness  pt is 87 year old female with a history of diabetes, left diabetic foot wound (sees podiatry) who presented to the ED s/p slip and fall with right hip pain at home, x-rays showed a right hip fx; now s/p THA anterior approach.  Clinical Impression  Patient tolerated evaluation well. Her mobility distance is limited. She requires min a for strength and stability. She had only a minor increase in pain with transfers. She would benefit from rehab at a SNF in order to improve mobility to the point where she can safely get to her bedroom on the second floor. Acute therapy will continue to work with the patient.     Follow Up Recommendations SNF    Equipment Recommendations  None recommended by PT    Recommendations for Other Services       Precautions / Restrictions Precautions Precautions: Anterior Hip Precaution Booklet Issued: No Precaution Comments: WBAT Restrictions Weight Bearing Restrictions: Yes RUE Weight Bearing: Weight bearing as tolerated      Mobility  Bed Mobility               General bed mobility comments: Patient found in a chair. She is motivated to work with therapy    Transfers Overall transfer level: Needs assistance Equipment used: Agricultural consultant (2 wheeled) Transfers: Sit to/from [employer] to Stand: Min assist Stand pivot transfers: Min guard       General transfer comment: min a for strength and inital balance. Sit to stand 2x with therapy  Ambulation/Gait Ambulation/Gait assistance: Min assist Gait Distance (Feet): 15 Feet Assistive device: Standard walker Gait Pattern/deviations: Step-to pattern Gait velocity: decreased Gait velocity interpretation: <1.31 ft/sec, indicative of household ambulator General Gait Details: slow step too patern. Had minor looss of balance but  able to self correct  Stairs            Wheelchair Mobility    Modified Rankin (Stroke Patients Only)       Balance Overall balance assessment: Mild deficits observed, not formally tested                                           Pertinent Vitals/Pain Pain Assessment: 0-10 Pain Score: 3  Pain Location: R LE Pain Descriptors / Indicators: Aching;Discomfort;Dull;Guarding Pain Intervention(s): Premedicated before session;Repositioned;Monitored during session    Home Living Family/patient expects to be discharged to:: Private residence Living Arrangements: Other relatives Available Help at Discharge: Family Type of Home: House Home Access: Stairs to enter Entrance Stairs-Rails: Left Entrance Stairs-Number of Steps: 3 Home Layout: Two level;Bed/bath upstairs Home Equipment: None Additional Comments: brother confirms walk in shower available on main level master bath. but pt typically resides on second level for bathroom and bedroom. no other history of falls    Prior Function Level of Independence: Independent         Comments: no hx of falls, indep with all ADL's and mobility. recently increased difficulty d/t need for wearing boot per podiatry     Hand Dominance   Dominant Hand: Right    Extremity/Trunk Assessment   Upper Extremity Assessment Upper Extremity Assessment: Defer to OT evaluation    Lower Extremity Assessment Lower Extremity Assessment: RLE deficits/detail RLE Deficits / Details: right LE with active movement with  pain RLE: Unable to fully assess due to pain    Cervical / Trunk Assessment Cervical / Trunk Assessment: Normal  Communication   Communication: No difficulties  Cognition Arousal/Alertness: Awake/alert Behavior During Therapy: WFL for tasks assessed/performed Overall Cognitive Status: Within Functional Limits for tasks assessed                                        General Comments  General comments (skin integrity, edema, etc.): bandage noted    Exercises     Assessment/Plan    PT Assessment Patient needs continued PT services  PT Problem List Decreased strength;Decreased range of motion;Decreased activity tolerance;Decreased balance;Decreased mobility;Decreased knowledge of use of DME;Pain       PT Treatment Interventions DME instruction;Stair training;Gait training;Functional mobility training;Therapeutic activities;Therapeutic exercise;Neuromuscular re-education;Patient/family education    PT Goals (Current goals can be found in the Care Plan section)  Acute Rehab PT Goals Patient Stated Goal: to be able to go home and be indep PT Goal Formulation: With patient Time For Goal Achievement:  (w+1) Potential to Achieve Goals: Good    Frequency Min 3X/week   Barriers to discharge   bedroom is on the second floor    Co-evaluation               AM-PAC PT "6 Clicks" Mobility  Outcome Measure Help needed turning from your back to your side while in a flat bed without using bedrails?: A Little Help needed moving from lying on your back to sitting on the side of a flat bed without using bedrails?: A Little Help needed moving to and from a bed to a chair (including a wheelchair)?: A Little Help needed standing up from a chair using your arms (e.g., wheelchair or bedside chair)?: A Lot Help needed to walk in hospital room?: A Lot Help needed climbing 3-5 steps with a railing? : A Lot 6 Click Score: 15    End of Session Equipment Utilized During Treatment: Gait belt Activity Tolerance: Patient tolerated treatment well Patient left: in chair;with call bell/phone within reach;with chair alarm set Nurse Communication: Mobility status PT Visit Diagnosis: Unsteadiness on feet (R26.81);Other abnormalities of gait and mobility (R26.89);Muscle weakness (generalized) (M62.81);Pain Pain - Right/Left: Right Pain - part of body: Hip    Time: 1212-1230 PT Time  Calculation (min) (ACUTE ONLY): 18 min   Charges:   PT Evaluation $PT Eval Moderate Complexity: 1 Mod           Dessie Coma PT DPT  12/22/2020, 1:22 PM

## 2020-12-22 NOTE — Progress Notes (Signed)
Received patient awake,alert/orientedx4 and able to verbalize needs. NAD noted; respirations easy/even on room air. Dressing to R hip c/d/i; sensation intact. Movement/ sensation noted to all extremities. L diabetic foot wound c/d/i.  White board updated. All safety measures in place; chair alarm on, call light within reach and personal belongings within reach.

## 2020-12-22 NOTE — Evaluation (Signed)
Occupational Therapy Evaluation Patient Details Name: Patricia Green MRN: [id number] DOB: Oct 24, 1929 Today's Date: 12/22/2020    History of Present Illness Pt is 87 year old female with a history of diabetes, left diabetic foot wound (sees podiatry) who presented to the ED s/p slip and fall with right hip pain at home, x-rays showed a right hip fx; now s/p THA anterior approach.   Clinical Impression   Pt pleasant and agreeable to session, brother present for entire session. Pt at baseline lives with brother in 2 level home, of which she uses both levels. Is typically Indep with all ADL's and IADL's, as well as mobility with no recent falls aside from fall leading to admission. Currently pt requiring min A for transition to and from standing and for in room mobility/negotiation with reliance on RW for support. Min A at least for LB ADL's and standing ADL's as pt with pain, deficits in activity tolerance, balance and strength. OT will continue to follow acutely, but pt and family motivated for participation therapy to maximize indep and safety prior to return to home.     Follow Up Recommendations  SNF;Supervision/Assistance - 24 hour    Equipment Recommendations  3 in 1 bedside commode;Tub/shower bench;Other (comment) (TBD)    Recommendations for Other Services       Precautions / Restrictions Precautions Precautions: Anterior Hip Precaution Booklet Issued: No Precaution Comments: WBAT Restrictions Weight Bearing Restrictions: Yes RUE Weight Bearing: Weight bearing as tolerated      Mobility Bed Mobility TBD  Transfers Overall transfer level: Needs assistance Equipment used: Rolling walker (2 wheeled) Transfers: Sit to/from [employer] Sit to Stand: Min assist Stand pivot transfers: Min guard General transfer comment: cues provided for modification of task completion to ensure safety and ease, relianceo n RW for UE support    Balance Overall balance assessment:  Mild deficits observed, not formally tested    ADL either performed or assessed with clinical judgement   ADL Overall ADL's : Needs assistance/impaired Eating/Feeding: Set up   Grooming: Wash/dry hands;Minimal assistance Grooming Details (indicate cue type and reason): simualted standing at bedside table, requiring cga-min A for safety with removal of UE support from RW. Upper Body Bathing: Set up;Sitting   Lower Body Bathing: Minimal assistance;Sit to/from stand   Upper Body Dressing : Supervision/safety   Lower Body Dressing: Minimal assistance;Sit to/from stand;Cueing for safety;Cueing for sequencing   Toilet Transfer: Minimal assistance;RW;Cueing for safety   Toileting- Clothing Manipulation and Hygiene: Minimal assistance;Cueing for safety       Functional mobility during ADLs: Minimal assistance;Rolling walker General ADL Comments: overally pt with cues for modification and safety, pt demo's needing grossly min A for ADL's LB>UB with sitting and standing tasks. limited by discomfort and mild balance deficits.     Vision Baseline Vision/History: No visual deficits Vision Assessment?: No apparent visual deficits     Perception     Praxis      Pertinent Vitals/Pain Pain Assessment: 0-10 Pain Score: 5  Pain Location: R LE Pain Descriptors / Indicators: Aching;Discomfort;Dull;Guarding Pain Intervention(s): Premedicated before session;Relaxation;Repositioned     Hand Dominance Right   Extremity/Trunk Assessment Upper Extremity Assessment Upper Extremity Assessment: Overall WFL for tasks assessed   Lower Extremity Assessment Lower Extremity Assessment: Defer to PT evaluation   Cervical / Trunk Assessment Cervical / Trunk Assessment: Normal   Communication Communication Communication: No difficulties   Cognition Arousal/Alertness: Awake/alert Behavior During Therapy: WFL for tasks assessed/performed Overall Cognitive Status: Within Functional Limits for  tasks assessed   General Comments  no significant edema noticed during session.    Exercises     Shoulder Instructions      Home Living Family/patient expects to be discharged to:: Private residence Living Arrangements: Other relatives (brother and sister-in-law) Available Help at Discharge: Family Type of Home: House Home Access: Stairs to enter Secretary/administrator of Steps: 3 Entrance Stairs-Rails: Left Home Layout: Two level;Bed/bath upstairs Alternate Level Stairs-Number of Steps: 14   Bathroom Shower/Tub: Tub/shower unit;Walk-in shower   Bathroom Toilet: Standard     Home Equipment: None   Additional Comments: brother confirms walk in shower available on main level master bath. but pt typically resides on second level for bathroom and bedroom. no other history of falls      Prior Functioning/Environment Level of Independence: Independent        Comments: no hx of falls, indep with all ADL's and mobility. recently increased difficulty d/t need for wearing boot per podiatry        OT Problem List: Decreased strength;Decreased activity tolerance;Impaired balance (sitting and/or standing);Decreased safety awareness;Decreased knowledge of precautions      OT Treatment/Interventions: Self-care/ADL training;Therapeutic exercise;DME and/or AE instruction;Therapeutic activities;Patient/family education;Balance training    OT Goals(Current goals can be found in the care plan section) Acute Rehab OT Goals Patient Stated Goal: to be able to go home and be indep OT Goal Formulation: With patient Time For Goal Achievement: 01/05/21 Potential to Achieve Goals: Good ADL Goals Pt Will Perform Lower Body Bathing: with modified independence Pt Will Perform Lower Body Dressing: with modified independence Pt Will Transfer to Toilet: with modified independence Pt Will Perform Tub/Shower Transfer: with modified independence Pt/caregiver will Perform Home Exercise Program: Both  right and left upper extremity;Increased strength;With theraband;With Supervision;With written HEP provided  OT Frequency: Min 2X/week   Barriers to D/C:            Co-evaluation              AM-PAC OT "6 Clicks" Daily Activity     Outcome Measure Help from another person eating meals?: None Help from another person taking care of personal grooming?: A Little Help from another person toileting, which includes using toliet, bedpan, or urinal?: A Little Help from another person bathing (including washing, rinsing, drying)?: A Little Help from another person to put on and taking off regular upper body clothing?: A Little Help from another person to put on and taking off regular lower body clothing?: A Little 6 Click Score: 19   End of Session Equipment Utilized During Treatment: Gait belt;Rolling walker Nurse Communication: Mobility status;Weight bearing status;Precautions  Activity Tolerance: Patient tolerated treatment well Patient left: in chair;with chair alarm set;with family/visitor present;with call bell/phone within reach  OT Visit Diagnosis: Unsteadiness on feet (R26.81)                Time: 2446-2863 OT Time Calculation (min): 32 min Charges:  OT General Charges $OT Visit: 1 Visit OT Evaluation $OT Eval Low Complexity: 1 Low OT Treatments $Self Care/Home Management : 8-22 mins  Raeven Pint OTR/L acute rehab services Office: [phone] 12/22/2020, 11:29 AM

## 2020-12-23 DIAGNOSIS — E11649 Type 2 diabetes mellitus with hypoglycemia without coma: Secondary | ICD-10-CM

## 2020-12-23 LAB — BASIC METABOLIC PANEL
Anion gap: 5 (ref 5–15)
BUN: 22 mg/dL (ref 8–23)
CO2: 25 mmol/L (ref 22–32)
Calcium: 8.1 mg/dL — ABNORMAL LOW (ref 8.9–10.3)
Chloride: 106 mmol/L (ref 98–111)
Creatinine, Ser: 1.27 mg/dL — ABNORMAL HIGH (ref 0.44–1.00)
GFR, Estimated: 41 mL/min — ABNORMAL LOW (ref >60)
Glucose, Bld: 92 mg/dL (ref 70–99)
Potassium: 3.6 mmol/L (ref 3.5–5.1)
Sodium: 136 mmol/L (ref 135–145)

## 2020-12-23 LAB — GLUCOSE, CAPILLARY
Glucose-Capillary: 57 mg/dL — ABNORMAL LOW (ref 70–99)
Glucose-Capillary: 77 mg/dL (ref 70–99)
Glucose-Capillary: 180 mg/dL — ABNORMAL HIGH (ref 70–99)
Glucose-Capillary: 197 mg/dL — ABNORMAL HIGH (ref 70–99)
Glucose-Capillary: 209 mg/dL — ABNORMAL HIGH (ref 70–99)
Glucose-Capillary: 224 mg/dL — ABNORMAL HIGH (ref 70–99)

## 2020-12-23 LAB — CBC
HCT: 27.5 % — ABNORMAL LOW (ref 36.0–46.0)
Hemoglobin: 9.3 g/dL — ABNORMAL LOW (ref 12.0–15.0)
MCH: 30.6 pg (ref 26.0–34.0)
MCHC: 33.8 g/dL (ref 30.0–36.0)
MCV: 90.5 fL (ref 80.0–100.0)
Platelets: 160 10*3/uL (ref 150–400)
RBC: 3.04 MIL/uL — ABNORMAL LOW (ref 3.87–5.11)
RDW: 12.5 % (ref 11.5–15.5)
WBC: 22.4 10*3/uL — ABNORMAL HIGH (ref 4.0–10.5)
nRBC: 0 % (ref 0.0–0.2)

## 2020-12-23 MED ORDER — INSULIN DETEMIR 100 UNIT/ML ~~LOC~~ SOLN
5.0000 [IU] | Freq: Two times a day (BID) | SUBCUTANEOUS | Status: DC
  Administered 2020-12-23 – 2020-12-24 (×2): 5 [IU] via SUBCUTANEOUS
  Filled 2020-12-23 (×4): qty 0.05

## 2020-12-23 NOTE — Plan of Care (Signed)
  Problem: Education: Goal: Knowledge of General Education information will improve Description Including pain rating scale, medication(s)/side effects and non-pharmacologic comfort measures Outcome: Progressing   Problem: Health Behavior/Discharge Planning: Goal: Ability to manage health-related needs will improve Outcome: Progressing   

## 2020-12-23 NOTE — NC FL2 (Signed)
[place] MEDICAID FL2 LEVEL OF CARE SCREENING TOOL     IDENTIFICATION  Patient Name: Patricia Green Birthdate: 1930-09-11 Sex: female Admission Date (Current Location): 12/20/2020  [place] and [place] Number:  Producer, television/film/video and Address:  [facility], [address], [place], [place] [address]      Provider Number: [id number]  Attending Physician Name and Address:  Elease Etienne, MD  Relative Name and Phone Number:  Jessie Foot [phone]    Current Level of Care: Hospital Recommended Level of Care: Skilled Nursing Facility Prior Approval Number:    Date Approved/Denied:   PASRR Number: [id number]  Discharge Plan: SNF    Current Diagnoses: Patient Active Problem List   Diagnosis Date Noted  . Malnutrition of moderate degree 12/21/2020  . Closed subcapital fracture of femur, right, initial encounter (HCC) 12/20/2020  . Type 2 diabetes mellitus with foot ulcer (HCC) 12/20/2020  . Hypoglycemia due to insulin 12/20/2020    Orientation RESPIRATION BLADDER Height & Weight     Self,Time,Situation,Place  Normal Continent Weight: 116 lb (52.6 kg) (stated weight; as of 12/17/20) Height:  5\' 5"  (165.1 cm) (stated height)  BEHAVIORAL SYMPTOMS/MOOD NEUROLOGICAL BOWEL NUTRITION STATUS      Continent Diet (please see discharge summary)  AMBULATORY STATUS COMMUNICATION OF NEEDS Skin   Supervision Verbally Surgical wounds (colsed incsision Rt Hip, wound incision diabetic ulcer toe, Anterior Left diabetic ulcer lateral foot)                       Personal Care Assistance Level of Assistance  Bathing,Feeding,Dressing Bathing Assistance: Limited assistance Feeding assistance: Independent Dressing Assistance: Limited assistance     Functional Limitations Info  Sight,Hearing,Speech Sight Info: Adequate Hearing Info: Adequate Speech Info: Adequate    SPECIAL CARE FACTORS FREQUENCY  PT (By licensed PT),OT (By licensed OT)     PT Frequency:  5x per week OT Frequency: 5x per week            Contractures Contractures Info: Not present    Additional Factors Info  Code Status,Allergies,Insulin Sliding Scale Code Status Info: DNR Allergies Info: NKA   Insulin Sliding Scale Info: see discharge summary       Current Medications (12/23/2020):  This is the current hospital active medication list Current Facility-Administered Medications  Medication Dose Route Frequency Provider Last Rate Last Admin  . (feeding supplement) PROSource Plus liquid 30 mL  30 mL Oral BID BM 02/22/2021, MD   30 mL at 12/23/20 0937  . acetaminophen (TYLENOL) tablet 325-650 mg  325-650 mg Oral Q6H PRN 02/22/21, MD   650 mg at 12/22/20 2129  . alum & mag hydroxide-simeth (MAALOX/MYLANTA) 200-200-20 MG/5ML suspension 30 mL  30 mL Oral Q4H PRN 2130, MD      . aspirin chewable tablet 81 mg  81 mg Oral QHS Tarry Kos, MD   81 mg at 12/22/20 2129  . docusate sodium (COLACE) capsule 100 mg  100 mg Oral BID 2130, MD   100 mg at 12/23/20 02/22/21  . enoxaparin (LOVENOX) injection 30 mg  30 mg Subcutaneous Q24H 5102, MD   30 mg at 12/23/20 0930  . feeding supplement (GLUCERNA SHAKE) (GLUCERNA SHAKE) liquid 237 mL  237 mL Oral TID BM 02/22/21, MD   237 mL at 12/22/20 2130  . HYDROmorphone (DILAUDID) injection 0.5-1 mg  0.5-1 mg Intravenous Q4H PRN 2131,  MD      . insulin aspart (novoLOG) injection 0-9 Units  0-9 Units Subcutaneous TID WC Elease Etienne, MD   3 Units at 12/22/20 1735  . insulin detemir (LEVEMIR) injection 5 Units  5 Units Subcutaneous BID Hongalgi, Anand D, MD      . magnesium citrate solution 1 Bottle  1 Bottle Oral Once PRN Tarry Kos, MD      . menthol-cetylpyridinium (CEPACOL) lozenge 3 mg  1 lozenge Oral PRN Tarry Kos, MD       Or  . phenol (CHLORASEPTIC) mouth spray 1 spray  1 spray Mouth/Throat PRN Tarry Kos, MD      . methocarbamol (ROBAXIN) tablet 500 mg  500 mg Oral Q6H PRN  Tarry Kos, MD       Or  . methocarbamol (ROBAXIN) 500 mg in dextrose 5 % 50 mL IVPB  500 mg Intravenous Q6H PRN Tarry Kos, MD      . multivitamin with minerals tablet 1 tablet  1 tablet Oral Daily Tarry Kos, MD   1 tablet at 12/23/20 [phone]  . ondansetron (ZOFRAN) tablet 4 mg  4 mg Oral Q6H PRN Tarry Kos, MD       Or  . ondansetron [facility]) injection 4 mg  4 mg Intravenous Q6H PRN Tarry Kos, MD      . oxyCODONE (Oxy IR/ROXICODONE) immediate release tablet 10-15 mg  10-15 mg Oral Q4H PRN Tarry Kos, MD      . oxyCODONE (Oxy IR/ROXICODONE) immediate release tablet 5-10 mg  5-10 mg Oral Q4H PRN Tarry Kos, MD   5 mg at 12/23/20 0659  . polyethylene glycol (MIRALAX / GLYCOLAX) packet 17 g  17 g Oral Daily Elease Etienne, MD   17 g at 12/23/20 0937  . sorbitol 70 % solution 30 mL  30 mL Oral Daily PRN Tarry Kos, MD         Discharge Medications: Please see discharge summary for a list of discharge medications.  Relevant Imaging Results:  Relevant Lab Results:   Additional Information SSN [id number]  Arvin Collard, [place]

## 2020-12-23 NOTE — Progress Notes (Signed)
Subjective: 2 Days Post-Op Procedure(s) (LRB): TOTAL HIP ARTHROPLASTY ANTERIOR APPROACH (Right) Patient reports pain as mild.  No complaints.   Objective: Vital signs in last 24 hours: Temp:  [98 F (36.7 C)-98.5 F (36.9 C)] 98 F (36.7 C) (04/10 0500) Pulse Rate:  [80-87] 80 (04/10 0500) Resp:  [14-16] 16 (04/10 0500) BP: (102-109)/(47-73) 108/73 (04/10 0500) SpO2:  [92 %-97 %] 96 % (04/10 0500)  Intake/Output from previous day: 04/09 0701 - 04/10 0700 In: 624.6 [I.V.:424.6; IV Piggyback:200] Out: -  Intake/Output this shift: No intake/output data recorded.  Recent Labs    12/20/20 1039 12/21/20 0259 12/21/20 1728 12/22/20 0122 12/23/20 0220  HGB 11.9* 11.0* 10.8* 10.1* 9.3*   Recent Labs    12/22/20 0122 12/23/20 0220  WBC 13.3* 22.4*  RBC 3.29* 3.04*  HCT 30.1* 27.5*  PLT 162 160   Recent Labs    12/22/20 0122 12/23/20 0220  NA 135 136  K 4.1 3.6  CL 104 106  CO2 26 25  BUN 20 22  CREATININE 1.33* 1.27*  GLUCOSE 196* 92  CALCIUM 8.7* 8.1*   No results for input(s): LABPT, INR in the last 72 hours.  Dorsiflexion/Plantar flexion intact Incision: dressing C/D/I Compartment soft   Assessment/Plan: 2 Days Post-Op Procedure(s) (LRB): TOTAL HIP ARTHROPLASTY ANTERIOR APPROACH (Right) Up with therapy      Tareek Sabo 12/23/2020, 7:41 AM

## 2020-12-23 NOTE — Plan of Care (Signed)

## 2020-12-23 NOTE — Progress Notes (Signed)
Physical Therapy Treatment Patient Details Name: Patricia Green MRN: [id number] DOB: June 04, 1930 Today's Date: 12/23/2020    History of Present Illness pt is 87 year old female with a history of diabetes, left diabetic foot wound (sees podiatry) who presented to the ED s/p slip and fall with right hip pain at home, x-rays showed a right hip fx; now s/p THA anterior approach.    PT Comments    Pt progressing well and demo'd improved transfer ability and ambulation tolerance. Pt with minimal tolerance of R LE WBing but is motivated to ambulate and do exercises to get better. Acute PT to cont to follow. Pt to cont to benefit from SNF to achieve safe mod I level of function and to be able to complete safe stair negotiation for safe transition home and to minimize fall risk.   Follow Up Recommendations  SNF;Supervision/Assistance - 24 hour     Equipment Recommendations   (TBD at next venue)    Recommendations for Other Services       Precautions / Restrictions Precautions Precautions: Anterior Hip Precaution Booklet Issued: No Precaution Comments: WBAT Restrictions Weight Bearing Restrictions: Yes RUE Weight Bearing: Weight bearing as tolerated    Mobility  Bed Mobility               General bed mobility comments: pt received in chair    Transfers Overall transfer level: Needs assistance Equipment used: Rolling walker (2 wheeled) Transfers: Sit to/from Stand Sit to Stand: Min assist         General transfer comment: minA to power up and steady during transition of hands from chair to RW, verbal cues to scoot forward to edge of chair  Ambulation/Gait Ambulation/Gait assistance: Min assist Gait Distance (Feet): 60 Feet Assistive device: Rolling walker (2 wheeled) Gait Pattern/deviations: Step-through pattern;Decreased stride length;Antalgic Gait velocity: dec Gait velocity interpretation: <1.31 ft/sec, indicative of household ambulator General Gait Details: pt  reports "Im only putting a little weight through that leg" pt initially with more of a step to gait pattern then transitioned to small step through gait pattern, pt c/o "My shoulders need a break" 2 standing rest breaks prior to pt needing to sit due to increased R hip pain   Stairs             Wheelchair Mobility    Modified Rankin (Stroke Patients Only)       Balance Overall balance assessment: Mild deficits observed, not formally tested (needs RW for safe amb)                                          Cognition Arousal/Alertness: Awake/alert Behavior During Therapy: WFL for tasks assessed/performed Overall Cognitive Status: Within Functional Limits for tasks assessed                                        Exercises General Exercises - Lower Extremity Ankle Circles/Pumps: AROM;Both;5 reps;Seated Gluteal Sets: AROM;Both;10 reps;Seated Long Arc Quad: AAROM;Right;10 reps;Seated    General Comments General comments (skin integrity, edema, etc.): dressing intact, VSS      Pertinent Vitals/Pain Pain Assessment: 0-10 Pain Score: 5  (s/p ambulation) Pain Location: R LE Pain Descriptors / Indicators: Aching;Discomfort;Dull;Guarding Pain Intervention(s): Monitored during session    Home Living  Prior Function            PT Goals (current goals can now be found in the care plan section) Acute Rehab PT Goals PT Goal Formulation: With patient Time For Goal Achievement: 01/05/21 Potential to Achieve Goals: Good    Frequency           PT Plan Current plan remains appropriate    Co-evaluation              AM-PAC PT "6 Clicks" Mobility   Outcome Measure  Help needed turning from your back to your side while in a flat bed without using bedrails?: A Little Help needed moving from lying on your back to sitting on the side of a flat bed without using bedrails?: A Little Help needed moving to  and from a bed to a chair (including a wheelchair)?: A Little Help needed standing up from a chair using your arms (e.g., wheelchair or bedside chair)?: A Little Help needed to walk in hospital room?: A Little Help needed climbing 3-5 steps with a railing? : A Lot 6 Click Score: 17    End of Session Equipment Utilized During Treatment: Gait belt Activity Tolerance: Patient tolerated treatment well Patient left: in chair;with call bell/phone within reach;with chair alarm set Nurse Communication: Mobility status PT Visit Diagnosis: Unsteadiness on feet (R26.81);Other abnormalities of gait and mobility (R26.89);Muscle weakness (generalized) (M62.81);Pain Pain - Right/Left: Right Pain - part of body: Hip     Time: 4196-2229 PT Time Calculation (min) (ACUTE ONLY): 16 min  Charges:  $Gait Training: 8-22 mins                     Lewis Shock, PT, DPT Acute Rehabilitation Services Pager #: [phone] Office #: [phone]    Patricia Green 12/23/2020, 1:53 PM

## 2020-12-23 NOTE — Progress Notes (Signed)
Discussed hypoglycemic events with Dr. Waymon Amato.  MD states to hold am doses of insulin (Levimir and Novalog) and he will adjust doses lower before lunch dose due.

## 2020-12-23 NOTE — TOC Initial Note (Addendum)
Transition of Care [facility]) - Initial/Assessment Note    Patient Details  Name: Diavion Labrador MRN: [id number] Date of Birth: Nov 09, 1929  Transition of Care [facility]) CM/SW Contact:    Gabrielle Dare Phone Number: 12/23/2020, 1:10 PM  Clinical Narrative:                 CSW met with pt at bedside.  Pt lives with her brother Marylyn Ishihara and sister-in-law Sharee Pimple.  Pt lives with family in a 2 story home.  Pt's bedroom is on the second floor.  Pt has 14 steps to walk up to her bedroom.  Pt has lived with them since October 2021. Pt has received her covid shots and booster.  She believes she received Pfier. Pt is agreeable to SNF for Rehab.  Pt gave CSW verbal permission to send out in hub.  Pt was given a medicare.gov list of SNF's.  Pt asked CSW to contact brother for a SNF choice.  Pt's brother could not be reached at the phone number listed on face sheet.  CSW also attempted to contact daughter.  Waiting for return call. TOC will continue to assist with disposition planning. Update: brother's phone number has been updated.  Left vm on brother's phone. 4:11pm: CSW spoke with pt's brother Marylyn Ishihara.  He was given medicare.gov information to review SNF which CSW sent out in hub. CSW will email pt's brother the list.  CSW informed pt's brother and sister in law that the accepting SNF's for [employer] is small.  Pt's brother and sister in law Sharee Pimple will check into her insurance to see what plans she has.  Expected Discharge Plan: Skilled Nursing Facility Barriers to Discharge: Continued Medical Work up,Insurance Authorization,SNF Pending bed offer   Patient Goals and CMS Choice Patient states their goals for this hospitalization and ongoing recovery are:: " to get stronger and to be able to walk again" CMS Medicare.gov Compare Post Acute Care list provided to:: Patient Choice offered to / list presented to : Patient  Expected Discharge Plan and Services Expected Discharge Plan: [place] In-house  Referral: Clinical Social Work     Living arrangements for the past 2 months: [place]                                      Prior Living Arrangements/Services Living arrangements for the past 2 months: Single Family Home Lives with:: Relatives Patient language and need for interpreter reviewed:: Yes Do you feel safe going back to the place where you live?: Yes      Need for Family Participation in Patient Care: Yes (Comment) Care giver support system in place?: Yes (comment)   Criminal Activity/Legal Involvement Pertinent to Current Situation/Hospitalization: No - Comment as needed  Activities of Daily Living Home Assistive Devices/Equipment: CBG Meter ADL Screening (condition at time of admission) Patient's cognitive ability adequate to safely complete daily activities?: Yes Is the patient deaf or have difficulty hearing?: No Does the patient have difficulty seeing, even when wearing glasses/contacts?: No Does the patient have difficulty concentrating, remembering, or making decisions?: No Patient able to express need for assistance with ADLs?: Yes Does the patient have difficulty dressing or bathing?: Yes Independently performs ADLs?: No Communication: Independent Dressing (OT): Needs assistance Is this a change from baseline?: Change from baseline, expected to last >3 days Grooming: Needs assistance Is this a change from baseline?: Change from baseline,  expected to last >3 days Feeding: Needs assistance Is this a change from baseline?: Change from baseline, expected to last >3 days Bathing: Needs assistance Is this a change from baseline?: Change from baseline, expected to last >3 days Toileting: Dependent Is this a change from baseline?: Change from baseline, expected to last >3days In/Out Bed: Dependent Is this a change from baseline?: Change from baseline, expected to last >3 days Walks in Home: Dependent Is this a change from baseline?: Change from  baseline, expected to last >3 days Does the patient have difficulty walking or climbing stairs?: Yes Weakness of Legs: Right Weakness of Arms/Hands: None  Permission Sought/Granted Permission sought to share information with : Case Manager,Facility Contact Representative,Family Supports Permission granted to share information with : Yes, Verbal Permission Granted  Share Information with NAME: Tillie Fantasia  Permission granted to share info w AGENCY: SNF  Permission granted to share info w Relationship: Brother  Permission granted to share info w Contact Information: yes  Emotional Assessment Appearance:: Appears stated age Attitude/Demeanor/Rapport: Gracious,Engaged Affect (typically observed): Appropriate,Accepting,Calm Orientation: : Oriented to Self,Oriented to Place,Oriented to  Time,Oriented to Situation Alcohol / Substance Use: Not Applicable Psych Involvement: No (comment)  Admission diagnosis:  Hip fracture ([place]) [S72.009A] Hypoglycemia [E16.2] Closed subcapital fracture of right femur, initial encounter ([place]) [S72.011A] Fall on same level from slipping, tripping or stumbling, initial encounter [W01.0XXA] Patient Active Problem List   Diagnosis Date Noted  . Malnutrition of moderate degree 12/21/2020  . Closed subcapital fracture of femur, right, initial encounter ([place]) 12/20/2020  . Type 2 diabetes mellitus with foot ulcer ([place]) 12/20/2020  . Hypoglycemia due to insulin 12/20/2020   PCP:  Kristen Loader, FNP Pharmacy:   [id number] [place] [id number]: [phone]: [phone]    Social Determinants of Health (SDOH) Interventions    Readmission Risk Interventions No flowsheet data found.

## 2020-12-23 NOTE — Progress Notes (Addendum)
PROGRESS NOTE   Patricia BabinskiMaxine Green  ZOX:[id number]    DOB: 06-23-1930    DOA: 12/20/2020  PCP: Soundra PilonBrake, Andrew R, FNP   I have briefly reviewed patients previous medical records in [facility].  Chief Complaint  Patient presents with  . Fall    Brief Narrative:  87 year old female, lives with her brother and his family, independent and functional at baseline, medical history significant for but not limited to type II DM, left diabetic foot wound (sees podiatry), possible dementia, who [facility] on 12/20/2020 after she slipped and fell and noted immediate right hip pain.  She had been wearing a boot on her left foot prescribed by the podiatrist and the boot slipped prompting her fall.  Admitted for right hip fracture and DM with hypoglycemia.  Orthopedics was consulted, patient transferred to [facility] and s/p right total hip arthroplasty 4/8.  Hospital course complicated by hypoglycemia on 4/10.   Assessment & Plan:  Principal Problem:   Closed subcapital fracture of femur, right, initial encounter [facility](HCC) Active Problems:   Type 2 diabetes mellitus with foot ulcer (HCC)   Hypoglycemia due to insulin   Malnutrition of moderate degree   Acute right hip fracture/right subcapital femoral neck fracture with displacement of fracture fragments, s/p right total hip arthroplasty 4/8 with  Sustained due to mechanical fall at home after her LLE boot slipped on the floor.  Suspect underlying osteoporosis.  Will need vitamin D levels and bone density checked in treated as needed as outpatient.  Orthopedics consulted and patiently underwent right total hip arthroplasty 4/8.  Multimodality pain control, DVT prophylaxis, weightbearing as per orthopedic team.  PT evaluated and recommend SNF.  Hopefully should be able to go to SNF in the next 1 to 2 days orthopedics follow-up appreciated  Acute kidney injury complicating stage IIIa CKD  Presented with  creatinine of 1.09.  No prior results to compare.  Creatinine has gone up to 1.33 >1.23 > 1.33 >1.27  Unclear etiology.  Clinically euvolemic, stop IV fluids.  Continue to trend BMPs.  Avoid nephrotoxics.  Type II DM with hypoglycemia, recurrent/hyperglycemia  Hypoglycemia on admission likely from taking insulin without significant oral intake.  Resolved with oral intake and D50 in ED.  A1c 6 indicates tight control in this very elderly female.  Despite placing her back on lower than her home dose of Levemir, patient had hypoglycemic episode with CBG of 57 early morning 4/10.  Held a.m. dose of Levemir and SSI.  Reduced Levemir Green further to 5 units twice daily and change SSI to sensitive.  Discussed with RN.  Monitor closely.  May be related to poor oral intake.  Patient states that she checks CBGs 3 times a day, denies hypoglycemia at home and confirms home dose of Levemir and does not take more than a unit or 2 of SSI  Left diabetic foot wound  Outpatient follow-up with podiatry.  WOC input appreciated.  Continue dressing changes per recommendations.  Postop acute blood loss anemia complicating anemia of possible chronic disease  Hemoglobin 11.9 on admission.  No baseline available.  Hemoglobin has drifted down to 9.3.  Follow CBCs and transfuse if hemoglobin 7 g or less.  Reactive leukocytosis  Mild.  No infectious etiology suspected.  Unclear why WBC is gone up from 13.3-22.4 in the absence of infectious etiology.  Follow CBC in a.m.  Body mass index is 19.3 kg/m.  Nutritional Status Nutrition Problem: Moderate Malnutrition Etiology:  chronic illness,other (see comment) (T2DM, age) Signs/Symptoms: mild fat depletion,moderate fat depletion,mild muscle depletion,moderate muscle depletion,severe muscle depletion,edema Interventions: MVI,Glucerna shake,Prostat    DVT prophylaxis: enoxaparin (LOVENOX) injection 30 mg Start: 12/22/20 0800 SCDs Start: 12/21/20  1614     Code Status: DNR Family Communication: I discussed in detail with patient's brother via phone, updated care and answered all questions. Disposition:  Status is: Inpatient  Remains inpatient appropriate because:Inpatient level of care appropriate due to severity of illness   Dispo: The patient is from: Home              Anticipated d/c is to: SNF              Patient currently is not medically stable to d/c.   Difficult to place patient No        Consultants:   Orthopedic  Procedures:   S/p right total hip arthroplasty 4/8  Antimicrobials:    Anti-infectives (From admission, onward)   Start     Dose/Rate Route Frequency Ordered Stop   12/22/20 0100  ceFAZolin (ANCEF) IVPB 2g/100 mL premix        2 g 200 mL/hr over 30 Minutes Intravenous Every 12 hours 12/21/20 1613 12/23/20 0218   12/21/20 1339  vancomycin (VANCOCIN) powder  Status:  Discontinued          As needed 12/21/20 1339 12/21/20 1439   12/21/20 1200  ceFAZolin (ANCEF) IVPB 2g/100 mL premix        2 g 200 mL/hr over 30 Minutes Intravenous On call to O.R. 12/20/20 1336 12/21/20 1320        Subjective:  Appropriate right hip pain, worse with movement.  Denies hypoglycemic episodes at home.  As per RN, poor oral intake.  Hypoglycemia this morning.  Also difficult ambulation and fall risk.  Objective:   Vitals:   12/22/20 0819 12/22/20 1938 12/23/20 0500 12/23/20 0833  BP: (!) 109/47 102/70 108/73 (!) 128/49  Pulse: 87 82 80 90  Resp: 14 16 16 16   Temp: 98.4 F (36.9 C) 98.5 F (36.9 C) 98 F (36.7 C) 98.4 F (36.9 C)  TempSrc: Oral Oral Oral Oral  SpO2: 97% 92% 96% 98%  Weight:      Height:        General exam: Pleasant elderly female, moderately built and frail, sitting up comfortably in reclining chair without distress. Respiratory system: Clear to auscultation.  No increased work of breathing. Cardiovascular system: S1 & S2 heard, RRR. No JVD, murmurs, rubs, gallops or clicks. No pedal  edema. Gastrointestinal system: Abdomen is nondistended, soft and nontender. No organomegaly or masses felt. Normal bowel sounds heard. Central nervous system: Alert and oriented.  No focal neurological deficits. Extremities: Symmetric 5 x 5 power.  Right hip surgical site dressing clean and dry Skin: No rashes, lesions or ulcers Psychiatry: Judgement and insight intact. Mood & affect pleasant and appropriate.     Data Reviewed:   I have personally reviewed following labs and imaging studies   CBC: Recent Labs  Lab 12/21/20 1728 12/22/20 0122 12/23/20 0220  WBC 17.7* 13.3* 22.4*  HGB 10.8* 10.1* 9.3*  HCT 32.5* 30.1* 27.5*  MCV 90.8 91.5 90.5  PLT 186 162 160    Basic Metabolic Panel: Recent Labs  Lab 12/21/20 0259 12/21/20 1728 12/22/20 0122 12/23/20 0220  NA 139  --  135 136  K 3.7  --  4.1 3.6  CL 108  --  104 106  CO2 25  --  26 25  GLUCOSE 153*  --  196* 92  BUN 26*  --  20 22  CREATININE 1.33* 1.23* 1.33* 1.27*  CALCIUM 8.8*  --  8.7* 8.1*    Liver Function Tests: No results for input(s): AST, ALT, ALKPHOS, BILITOT, PROT, ALBUMIN in the last 168 hours.  CBG: Recent Labs  Lab 12/23/20 0752 12/23/20 0935 12/23/20 1143  GLUCAP 77 180* 197*    Microbiology Studies:   Recent Results (from the past 240 hour(s))  Resp Panel by RT-PCR (Flu A&B, Covid) Nasopharyngeal Swab     Status: None   Collection Time: 12/20/20 10:39 AM   Specimen: Nasopharyngeal Swab; Nasopharyngeal(NP) swabs in vial transport medium  Result Value Ref Range Status   SARS Coronavirus 2 by RT PCR NEGATIVE NEGATIVE Final    Comment: (NOTE) SARS-CoV-2 target nucleic acids are NOT DETECTED.  The SARS-CoV-2 RNA is generally detectable in upper respiratory specimens during the acute phase of infection. The lowest concentration of SARS-CoV-2 viral copies this assay can detect is 138 copies/mL. A negative result does not preclude SARS-Cov-2 infection and should not be used as the sole  basis for treatment or other patient management decisions. A negative result may occur with  improper specimen collection/handling, submission of specimen other than nasopharyngeal swab, presence of viral mutation(s) within the areas targeted by this assay, and inadequate number of viral copies(<138 copies/mL). A negative result must be combined with clinical observations, patient history, and epidemiological information. The expected result is Negative.  Fact Sheet for Patients:  BloggerCourse.com  Fact Sheet for Healthcare Providers:  SeriousBroker.it  This test is no t yet approved or cleared by the [place] FDA and  has been authorized for detection and/or diagnosis of SARS-CoV-2 by FDA under an Emergency Use Authorization (EUA). This EUA will remain  in effect (meaning this test can be used) for the duration of the COVID-19 declaration under Section 564(b)(1) of the Act, 21 U.S.C.section 360bbb-3(b)(1), unless the authorization is terminated  or revoked sooner.       Influenza A by PCR NEGATIVE NEGATIVE Final   Influenza B by PCR NEGATIVE NEGATIVE Final    Comment: (NOTE) The Xpert Xpress SARS-CoV-2/FLU/RSV plus assay is intended as an aid in the diagnosis of influenza from Nasopharyngeal swab specimens and should not be used as a sole basis for treatment. Nasal washings and aspirates are unacceptable for Xpert Xpress SARS-CoV-2/FLU/RSV testing.  Fact Sheet for Patients: BloggerCourse.com  Fact Sheet for Healthcare Providers: SeriousBroker.it  This test is not yet approved or cleared by the [place] FDA and has been authorized for detection and/or diagnosis of SARS-CoV-2 by FDA under an Emergency Use Authorization (EUA). This EUA will remain in effect (meaning this test can be used) for the duration of the COVID-19 declaration under Section 564(b)(1) of the Act,  21 U.S.C. section 360bbb-3(b)(1), unless the authorization is terminated or revoked.  Performed at [facility], [address], [place], [place] [address]   Surgical pcr screen     Status: None   Collection Time: 12/20/20  5:46 PM   Specimen: Nasal Mucosa; Nasal Swab  Result Value Ref Range Status   MRSA, PCR NEGATIVE NEGATIVE Final   Staphylococcus aureus NEGATIVE NEGATIVE Final    Comment: (NOTE) The Xpert SA Assay (FDA approved for NASAL specimens in patients 36 years of age and older), is one component of a comprehensive surveillance program. It is not intended to diagnose infection nor to guide or monitor treatment. Performed at  [facility], [address] [place], [place] [address]      Radiology Studies:  DG Pelvis Portable  Result Date: 12/21/2020 CLINICAL DATA:  Fracture. EXAM: PORTABLE PELVIS 1-2 VIEWS COMPARISON:  X-ray C-arm 12/21/2020, x-ray hip 12/20/2020 FINDINGS: Status post open reduction and internal fixation of the right hip with total right hip arthroplasty. There is no evidence of new acute pelvic fracture or diastasis. No pelvic bone lesions are seen. Subcutaneus soft tissue edema and emphysema of the right hip consistent with postsurgical changes. IMPRESSION: Status post total right hip arthroplasty. No radiographic findings of surgical complication. Electronically Signed   By: Tish Frederickson M.D.   On: 12/21/2020 23:31   DG C-Arm 1-60 Min  Result Date: 12/21/2020 CLINICAL DATA:  Total hip arthroplasty. EXAM: OPERATIVE RIGHT HIP (WITH PELVIS IF PERFORMED) TECHNIQUE: Fluoroscopic spot image(s) were submitted for interpretation post-operatively. COMPARISON:  Preoperative radiograph 12/20/2020 FINDINGS: Two fluoroscopic spot views of the right hip in frontal projection demonstrate total hip arthroplasty in expected alignment. Total fluoroscopy in 20.1 seconds. Total dose 1.2373 mGy. IMPRESSION: Procedural fluoroscopy for right hip  arthroplasty. Electronically Signed   By: Narda Rutherford M.D.   On: 12/21/2020 15:11   DG HIP OPERATIVE UNILAT W OR W/O PELVIS RIGHT  Result Date: 12/21/2020 CLINICAL DATA:  Total hip arthroplasty. EXAM: OPERATIVE RIGHT HIP (WITH PELVIS IF PERFORMED) TECHNIQUE: Fluoroscopic spot image(s) were submitted for interpretation post-operatively. COMPARISON:  Preoperative radiograph 12/20/2020 FINDINGS: Two fluoroscopic spot views of the right hip in frontal projection demonstrate total hip arthroplasty in expected alignment. Total fluoroscopy in 20.1 seconds. Total dose 1.2373 mGy. IMPRESSION: Procedural fluoroscopy for right hip arthroplasty. Electronically Signed   By: Narda Rutherford M.D.   On: 12/21/2020 15:11     Scheduled Meds:   . (feeding supplement) PROSource Plus  30 mL Oral BID BM  . aspirin  81 mg Oral QHS  . docusate sodium  100 mg Oral BID  . enoxaparin (LOVENOX) injection  30 mg Subcutaneous Q24H  . feeding supplement (GLUCERNA SHAKE)  237 mL Oral TID BM  . insulin aspart  0-5 Units Subcutaneous QHS  . insulin aspart  0-9 Units Subcutaneous TID WC  . insulin detemir  10 Units Subcutaneous BID  . multivitamin with minerals  1 tablet Oral Daily  . polyethylene glycol  17 g Oral Daily    Continuous Infusions:   . methocarbamol (ROBAXIN) IV       LOS: 3 days     Marcellus Scott, MD, [place], [facility]. [facility]    To contact the attending provider between 7A-7P or the covering provider during after hours 7P-7A, please log into the web site [email or web address] and access using universal [place] password for that web site. If you do not have the password, please call the hospital operator.  12/23/2020, 12:56 PM

## 2020-12-24 ENCOUNTER — Encounter (HOSPITAL_COMMUNITY): Payer: Self-pay | Admitting: Orthopaedic Surgery

## 2020-12-24 LAB — CBC
HCT: 26.1 % — ABNORMAL LOW (ref 36.0–46.0)
Hemoglobin: 8.8 g/dL — ABNORMAL LOW (ref 12.0–15.0)
MCH: 29.9 pg (ref 26.0–34.0)
MCHC: 33.7 g/dL (ref 30.0–36.0)
MCV: 88.8 fL (ref 80.0–100.0)
Platelets: 156 10*3/uL (ref 150–400)
RBC: 2.94 MIL/uL — ABNORMAL LOW (ref 3.87–5.11)
RDW: 12.7 % (ref 11.5–15.5)
WBC: 16.5 10*3/uL — ABNORMAL HIGH (ref 4.0–10.5)
nRBC: 0 % (ref 0.0–0.2)

## 2020-12-24 LAB — BASIC METABOLIC PANEL
Anion gap: 7 (ref 5–15)
BUN: 31 mg/dL — ABNORMAL HIGH (ref 8–23)
CO2: 24 mmol/L (ref 22–32)
Calcium: 8 mg/dL — ABNORMAL LOW (ref 8.9–10.3)
Chloride: 104 mmol/L (ref 98–111)
Creatinine, Ser: 1.32 mg/dL — ABNORMAL HIGH (ref 0.44–1.00)
GFR, Estimated: 39 mL/min — ABNORMAL LOW (ref >60)
Glucose, Bld: 191 mg/dL — ABNORMAL HIGH (ref 70–99)
Potassium: 4.1 mmol/L (ref 3.5–5.1)
Sodium: 135 mmol/L (ref 135–145)

## 2020-12-24 LAB — GLUCOSE, CAPILLARY
Glucose-Capillary: 164 mg/dL — ABNORMAL HIGH (ref 70–99)
Glucose-Capillary: 207 mg/dL — ABNORMAL HIGH (ref 70–99)
Glucose-Capillary: 169 mg/dL — ABNORMAL HIGH (ref 70–99)
Glucose-Capillary: 195 mg/dL — ABNORMAL HIGH (ref 70–99)

## 2020-12-24 MED ORDER — INSULIN DETEMIR 100 UNIT/ML ~~LOC~~ SOLN
7.0000 [IU] | Freq: Two times a day (BID) | SUBCUTANEOUS | Status: DC
  Administered 2020-12-24 – 2020-12-27 (×6): 7 [IU] via SUBCUTANEOUS
  Filled 2020-12-24 (×7): qty 0.07

## 2020-12-24 NOTE — Progress Notes (Signed)
PROGRESS NOTE   Patricia BabinskiMaxine Green  ZHY:[id number]    DOB: 1929-12-09    DOA: 12/20/2020  PCP: Soundra PilonBrake, Andrew R, FNP   I have briefly reviewed patients previous medical records in [facility].  Chief Complaint  Patient presents with  . Fall    Brief Narrative:  87 year old female, lives with her brother and his family, independent and functional at baseline, medical history significant for but not limited to type II DM, left diabetic foot wound (sees podiatry), possible dementia, who presented initially to [facility] ED on 12/20/2020 after she slipped and fell and noted immediate right hip pain.  She had been wearing a boot on her left foot prescribed by the podiatrist and the boot slipped prompting her fall.  Admitted for right hip fracture and DM with hypoglycemia.  Orthopedics was consulted, patient transferred to [facility] and s/p right total hip arthroplasty 4/8.  Hospital course complicated by hypoglycemia on 4/10.   Assessment & Plan:  Principal Problem:   Closed subcapital fracture of femur, right, initial encounter [facility](HCC) Active Problems:   Type 2 diabetes mellitus with foot ulcer (HCC)   Hypoglycemia due to insulin   Malnutrition of moderate degree   Acute right hip fracture/right subcapital femoral neck fracture with displacement of fracture fragments, s/p right total hip arthroplasty 4/8 with  Sustained due to mechanical fall at home after her LLE boot slipped on the floor.  Suspect underlying osteoporosis.  Will need vitamin D levels and bone density checked in treated as needed as outpatient.  Orthopedics consulted and patiently underwent right total hip arthroplasty 4/8.  Multimodality pain control, DVT prophylaxis, weightbearing as per orthopedic team.  PT evaluated and recommend SNF.  Hopefully should be able to go to SNF in the next 1 to 2 days orthopedics follow-up appreciated  Stable.  Acute kidney injury complicating stage IIIa CKD  Presented  with creatinine of 1.09.  No prior results to compare.  Creatinine has gone up to 1.33 >1.23 > 1.33 >1.27 >1.32  Unclear etiology as to why her creatinine bumped up from her baseline but is relatively stable in the 1.2-1.3 range.  Clinically euvolemic, stop IV fluids.  Continue to trend BMPs.  Avoid nephrotoxics.  Bladder scan without urinary retention.  Type II DM with hypoglycemia, recurrent/hyperglycemia  Hypoglycemia on admission likely from taking insulin without significant oral intake.  Resolved with oral intake and D50 in ED.  A1c 6 indicates tight control in this very elderly female.  Despite placing her back on lower than her home dose of Levemir, patient had hypoglycemic episode with CBG of 57 early morning 4/10.  Held a.m. dose of Levemir and SSI.  Reduced Levemir even further to 5 units twice daily and change SSI to sensitive.  Discussed with RN.  Monitor closely.  May be related to poor oral intake.  Patient states that she checks CBGs 3 times a day, denies hypoglycemia at home and confirms home dose of Levemir and does not take more than a unit or 2 of SSI  CBG slightly up in the 200s.  Will increase Levemir to 7 units twice daily.  Left diabetic foot wound  Outpatient follow-up with podiatry.  WOC input appreciated.  Continue dressing changes per recommendations.  Postop acute blood loss anemia complicating anemia of possible chronic disease  Hemoglobin 11.9 on admission.  No baseline available.  Hemoglobin has drifted down to 9.3 >8.8.  Follow CBCs and transfuse if hemoglobin 7 g or less.  Reactive leukocytosis  Mild.  No infectious etiology suspected.  Unclear why WBC is gone up from 13.3-22.4 in the absence of infectious etiology.  Improved today to 16.5  Follow CBC in a.m.  Body mass index is 19.3 kg/m.  Nutritional Status Nutrition Problem: Moderate Malnutrition Etiology: chronic illness,other (see comment) (T2DM, age) Signs/Symptoms: mild fat  depletion,moderate fat depletion,mild muscle depletion,moderate muscle depletion,severe muscle depletion,edema Interventions: MVI,Glucerna shake,Prostat    DVT prophylaxis: enoxaparin (LOVENOX) injection 30 mg Start: 12/22/20 0800 SCDs Start: 12/21/20 1614     Code Status: DNR Family Communication: I discussed in detail with patient's brother via phone on 4/10, updated care and answered all questions. Disposition:  Status is: Inpatient  Remains inpatient appropriate because:Inpatient level of care appropriate due to severity of illness   Dispo: The patient is from: Home              Anticipated d/c is to: SNF              Patient currently is not medically stable to d/c.   Difficult to place patient No        Consultants:   Orthopedic  Procedures:   S/p right total hip arthroplasty 4/8  Antimicrobials:    Anti-infectives (From admission, onward)   Start     Dose/Rate Route Frequency Ordered Stop   12/22/20 0100  ceFAZolin (ANCEF) IVPB 2g/100 mL premix        2 g 200 mL/hr over 30 Minutes Intravenous Every 12 hours 12/21/20 1613 12/23/20 0218   12/21/20 1339  vancomycin (VANCOCIN) powder  Status:  Discontinued          As needed 12/21/20 1339 12/21/20 1439   12/21/20 1200  ceFAZolin (ANCEF) IVPB 2g/100 mL premix        2 g 200 mL/hr over 30 Minutes Intravenous On call to O.R. 12/20/20 1336 12/21/20 1320        Subjective:  Seen patient in room along with PT.  Some moderate postop right hip pain.  Urinating well.  No other complaints reported.  Objective:   Vitals:   12/23/20 1612 12/23/20 2102 12/24/20 0447 12/24/20 0836  BP: (!) 136/92 (!) 102/52 (!) 111/49 (!) 129/50  Pulse: 79 86 81 90  Resp: 17 14 14 17   Temp: 97.9 F (36.6 C) 98 F (36.7 C) 98.1 F (36.7 C) 98.2 F (36.8 C)  TempSrc: Oral Oral Oral Oral  SpO2:  98% 97% 99%  Weight:      Height:        General exam: Pleasant elderly female, moderately built and frail, sitting up in reclining  chair, working with PT. Respiratory system: Clear to auscultation.  No increased work of breathing. Cardiovascular system: S1 & S2 heard, RRR. No JVD, murmurs, rubs, gallops or clicks. No pedal edema. Gastrointestinal system: Abdomen is nondistended, soft and nontender. No organomegaly or masses felt. Normal bowel sounds heard. Central nervous system: Alert and oriented.  No focal neurological deficits. Extremities: Symmetric 5 x 5 power.  Right hip/groin surgical site dressing clean and dry Skin: No rashes, lesions or ulcers Psychiatry: Judgement and insight intact. Mood & affect pleasant and appropriate.     Data Reviewed:   I have personally reviewed following labs and imaging studies   CBC: Recent Labs  Lab 12/22/20 0122 12/23/20 0220 12/24/20 0223  WBC 13.3* 22.4* 16.5*  HGB 10.1* 9.3* 8.8*  HCT 30.1* 27.5* 26.1*  MCV 91.5 90.5 88.8  PLT 162 160 156  Basic Metabolic Panel: Recent Labs  Lab 12/22/20 0122 12/23/20 0220 12/24/20 0223  NA 135 136 135  K 4.1 3.6 4.1  CL 104 106 104  CO2 26 25 24   GLUCOSE 196* 92 191*  BUN 20 22 31*  CREATININE 1.33* 1.27* 1.32*  CALCIUM 8.7* 8.1* 8.0*    Liver Function Tests: No results for input(s): AST, ALT, ALKPHOS, BILITOT, PROT, ALBUMIN in the last 168 hours.  CBG: Recent Labs  Lab 12/23/20 1937 12/24/20 0640 12/24/20 1206  GLUCAP 224* 164* 207*    Microbiology Studies:   Recent Results (from the past 240 hour(s))  Resp Panel by RT-PCR (Flu A&B, Covid) Nasopharyngeal Swab     Status: None   Collection Time: 12/20/20 10:39 AM   Specimen: Nasopharyngeal Swab; Nasopharyngeal(NP) swabs in vial transport medium  Result Value Ref Range Status   SARS Coronavirus 2 by RT PCR NEGATIVE NEGATIVE Final    Comment: (NOTE) SARS-CoV-2 target nucleic acids are NOT DETECTED.  The SARS-CoV-2 RNA is generally detectable in upper respiratory specimens during the acute phase of infection. The lowest concentration of SARS-CoV-2  viral copies this assay can detect is 138 copies/mL. A negative result does not preclude SARS-Cov-2 infection and should not be used as the sole basis for treatment or other patient management decisions. A negative result may occur with  improper specimen collection/handling, submission of specimen other than nasopharyngeal swab, presence of viral mutation(s) within the areas targeted by this assay, and inadequate number of viral copies(<138 copies/mL). A negative result must be combined with clinical observations, patient history, and epidemiological information. The expected result is Negative.  Fact Sheet for Patients:  02/19/21  Fact Sheet for Healthcare Providers:  BloggerCourse.com  This test is no t yet approved or cleared by the [employer].[place] FDA and  has been authorized for detection and/or diagnosis of SARS-CoV-2 by FDA under an Emergency Use Authorization (EUA). This EUA will remain  in effect (meaning this test can be used) for the duration of the COVID-19 declaration under Section 564(b)(1) of the Act, 21 U.S.C.section 360bbb-3(b)(1), unless the authorization is terminated  or revoked sooner.       Influenza A by PCR NEGATIVE NEGATIVE Final   Influenza B by PCR NEGATIVE NEGATIVE Final    Comment: (NOTE) The Xpert Xpress SARS-CoV-2/FLU/RSV plus assay is intended as an aid in the diagnosis of influenza from Nasopharyngeal swab specimens and should not be used as a sole basis for treatment. Nasal washings and aspirates are unacceptable for Xpert Xpress SARS-CoV-2/FLU/RSV testing.  Fact Sheet for Patients: [place]  Fact Sheet for Healthcare Providers: BloggerCourse.com  This test is not yet approved or cleared by the [employer]ious[employer]ker.it FDA and has been authorized for detection and/or diagnosis of SARS-CoV-2 by FDA under an Emergency Use Authorization  (EUA). This EUA will remain in effect (meaning this test can be used) for the duration of the COVID-19 declaration under Section 564(b)(1) of the Act, 21 U.S.C. section 360bbb-3(b)(1), unless the authorization is terminated or revoked.  Performed at [facility], [address] [place], [place]   Surgical pcr screen     Status: None   Collection Time: 12/20/20  5:46 PM   Specimen: Nasal Mucosa; Nasal Swab  Result Value Ref Range Status   MRSA, PCR NEGATIVE NEGATIVE Final   Staphylococcus aureus NEGATIVE NEGATIVE Final    Comment: (NOTE) The Xpert SA Assay (FDA approved for NASAL specimens in patients 36 years of age and older), is one  component of a comprehensive surveillance program. It is not intended to diagnose infection nor to guide or monitor treatment. Performed at [facility], [address] [place], [place] [address]      Radiology Studies:  No results found.   Scheduled Meds:   . (feeding supplement) PROSource Plus  30 mL Oral BID BM  . aspirin  81 mg Oral QHS  . docusate sodium  100 mg Oral BID  . enoxaparin (LOVENOX) injection  30 mg Subcutaneous Q24H  . feeding supplement (GLUCERNA SHAKE)  237 mL Oral TID BM  . insulin aspart  0-9 Units Subcutaneous TID WC  . insulin detemir  5 Units Subcutaneous BID  . multivitamin with minerals  1 tablet Oral Daily  . polyethylene glycol  17 g Oral Daily    Continuous Infusions:   . methocarbamol (ROBAXIN) IV       LOS: 4 days     Marcellus Scott, MD, [place], [facility]. [facility]    To contact the attending provider between 7A-7P or the covering provider during after hours 7P-7A, please log into the web site [email or web address] and access using universal [place] password for that web site. If you do not have the password, please call the hospital operator.  12/24/2020, 1:20 PM

## 2020-12-24 NOTE — Plan of Care (Signed)

## 2020-12-24 NOTE — TOC Progression Note (Addendum)
Transition of Care [facility]) - Progression Note    Patient Details  Name: Patricia Green MRN: [id number] Date of Birth: [birth year]  Transition of Care [facility] CM/SW Contact  Epifanio Lesches, RN Phone Number: 12/24/2020, 5:40 PM  Clinical Narrative:    SNF bed offer shared and pt accepted [facility]. [facility] extended bed offer. Insurance auth.initiated by SN[facility]. Pending.  TOC team following  Expected Discharge Plan: Skilled Nursing Facility Barriers to Discharge: Insurance Authorization  Expected Discharge Plan and Services Expected Discharge Plan: Skilled Nursing Facility In-house Referral: Clinical Social Work     Living arrangements for the past 2 months: Single Family Home                                       Social Determinants of Health (SDOH) Interventions    Readmission Risk Interventions No flowsheet data found.

## 2020-12-24 NOTE — Progress Notes (Signed)
Patient bladder scan showed 165 ml this morning. Pt has voided twice during the shift. Pt is able to walk to the bathroom with 1 person assist. Blood sugar this morning is 164.

## 2020-12-24 NOTE — Progress Notes (Signed)
Physical Therapy Treatment Patient Details Name: Patricia Green MRN: [id number] DOB: 24-Jan-1930 Today's Date: 12/24/2020    History of Present Illness pt is 87 year old female with a history of diabetes, left diabetic foot wound (sees podiatry) who presented to the ED s/p slip and fall with right hip pain at home, x-rays showed a right hip fx; now s/p THA anterior approach.    PT Comments    Pt with request to use bathroom. Pt with increased pain today, asked RN for pain meds however wasn't able to provide before or during session. Pt tolerating ROM and exercises well. Ambulation limited today by pain in R LE. Pt was able to perform pericare in sitting s/p urination. Acute PT to cont to follow.    Follow Up Recommendations  SNF;Supervision/Assistance - 24 hour     Equipment Recommendations  Rolling walker with 5" wheels    Recommendations for Other Services       Precautions / Restrictions Precautions Precautions: Anterior Hip Precaution Booklet Issued: No Precaution Comments: WBAT - R LE Restrictions Weight Bearing Restrictions: Yes RUE Weight Bearing: Weight bearing as tolerated    Mobility  Bed Mobility Overal bed mobility: Needs Assistance Bed Mobility: Supine to Sit     Supine to sit: Min assist;Mod assist;HOB elevated     General bed mobility comments: pt initiated transfer, reached for bed rail with R UE, requiring assist for R LE and trunk elevation    Transfers Overall transfer level: Needs assistance Equipment used: Rolling walker (2 wheeled) Transfers: Sit to/from Stand Sit to Stand: Min assist         General transfer comment: minA to power up, verbal cues for hand placement when standing up from [employer]  Ambulation/Gait Ambulation/Gait assistance: Min assist Gait Distance (Feet): 60 Feet (limited by pain today, requested pain meds at beginning of session however haven't been given any as of yet, pt agreeable to try. pt did walk to/from bathroom with  PT as well) Assistive device: Rolling walker (2 wheeled) Gait Pattern/deviations: Step-through pattern;Decreased stride length;Step-to pattern Gait velocity: dec Gait velocity interpretation: <1.31 ft/sec, indicative of household ambulator General Gait Details: pt initially with step to pattern progressing to short step through pattern. Pt with increased pain limited tolerance/endurance   Stairs             Wheelchair Mobility    Modified Rankin (Stroke Patients Only)       Balance Overall balance assessment: Needs assistance Sitting-balance support: Feet supported;No upper extremity supported Sitting balance-Leahy Scale: Good     Standing balance support: No upper extremity supported;During functional activity Standing balance-Leahy Scale: Fair Standing balance comment: pt leaned on sink with abdomen to wash hands at sink                            Cognition Arousal/Alertness: Awake/alert Behavior During Therapy: WFL for tasks assessed/performed Overall Cognitive Status: Within Functional Limits for tasks assessed                                        Exercises General Exercises - Lower Extremity Quad Sets: AROM;Right;10 reps;Supine Long Arc Quad: AAROM;Right;10 reps;Seated Hip Flexion/Marching: AAROM;Right;10 reps;Supine (knee to chest)    General Comments General comments (skin integrity, edema, etc.): pt with some bruising on L LE, dressing intact on R LE  Pertinent Vitals/Pain Pain Assessment: 0-10 Pain Score: 6  Pain Location: R LE with movement Pain Descriptors / Indicators: Aching;Discomfort;Dull;Guarding Pain Intervention(s): Monitored during session    Home Living                      Prior Function            PT Goals (current goals can now be found in the care plan section) Progress towards PT goals: Progressing toward goals    Frequency    Min 3X/week      PT Plan Current plan remains  appropriate    Co-evaluation              AM-PAC PT "6 Clicks" Mobility   Outcome Measure  Help needed turning from your back to your side while in a flat bed without using bedrails?: A Little Help needed moving from lying on your back to sitting on the side of a flat bed without using bedrails?: A Little Help needed moving to and from a bed to a chair (including a wheelchair)?: A Little Help needed standing up from a chair using your arms (e.g., wheelchair or bedside chair)?: A Little Help needed to walk in hospital room?: A Little Help needed climbing 3-5 steps with a railing? : A Lot 6 Click Score: 17    End of Session Equipment Utilized During Treatment: Gait belt Activity Tolerance: Patient tolerated treatment well Patient left: in chair;with call bell/phone within reach;with chair alarm set Nurse Communication: Mobility status PT Visit Diagnosis: Unsteadiness on feet (R26.81);Other abnormalities of gait and mobility (R26.89);Muscle weakness (generalized) (M62.81);Pain Pain - Right/Left: Right Pain - part of body: Hip     Time: 0947-0962 PT Time Calculation (min) (ACUTE ONLY): 32 min  Charges:  $Gait Training: 8-22 mins $Therapeutic Exercise: 8-22 mins                     Lewis Shock, PT, DPT Acute Rehabilitation Services Pager #: [phone] Office #: [phone] ]   [name] 12/24/2020, 10:36 AM

## 2020-12-25 LAB — GLUCOSE, CAPILLARY
Glucose-Capillary: 105 mg/dL — ABNORMAL HIGH (ref 70–99)
Glucose-Capillary: 147 mg/dL — ABNORMAL HIGH (ref 70–99)
Glucose-Capillary: 166 mg/dL — ABNORMAL HIGH (ref 70–99)
Glucose-Capillary: 211 mg/dL — ABNORMAL HIGH (ref 70–99)
Glucose-Capillary: 256 mg/dL — ABNORMAL HIGH (ref 70–99)

## 2020-12-25 LAB — CBC
HCT: 25.8 % — ABNORMAL LOW (ref 36.0–46.0)
Hemoglobin: 8.7 g/dL — ABNORMAL LOW (ref 12.0–15.0)
MCH: 31.3 pg (ref 26.0–34.0)
MCHC: 33.7 g/dL (ref 30.0–36.0)
MCV: 92.8 fL (ref 80.0–100.0)
Platelets: 196 10*3/uL (ref 150–400)
RBC: 2.78 MIL/uL — ABNORMAL LOW (ref 3.87–5.11)
RDW: 12.7 % (ref 11.5–15.5)
WBC: 12.9 10*3/uL — ABNORMAL HIGH (ref 4.0–10.5)
nRBC: 0 % (ref 0.0–0.2)

## 2020-12-25 LAB — BASIC METABOLIC PANEL
Anion gap: 4 — ABNORMAL LOW (ref 5–15)
BUN: 34 mg/dL — ABNORMAL HIGH (ref 8–23)
CO2: 27 mmol/L (ref 22–32)
Calcium: 7.9 mg/dL — ABNORMAL LOW (ref 8.9–10.3)
Chloride: 104 mmol/L (ref 98–111)
Creatinine, Ser: 1.41 mg/dL — ABNORMAL HIGH (ref 0.44–1.00)
GFR, Estimated: 36 mL/min — ABNORMAL LOW (ref >60)
Glucose, Bld: 178 mg/dL — ABNORMAL HIGH (ref 70–99)
Potassium: 3.9 mmol/L (ref 3.5–5.1)
Sodium: 135 mmol/L (ref 135–145)

## 2020-12-25 LAB — CK: Total CK: 102 U/L (ref 38–234)

## 2020-12-25 MED ORDER — INSULIN ASPART 100 UNIT/ML ~~LOC~~ SOLN
0.0000 [IU] | Freq: Three times a day (TID) | SUBCUTANEOUS | Status: DC
  Administered 2020-12-26 (×2): 2 [IU] via SUBCUTANEOUS
  Administered 2020-12-27: 3 [IU] via SUBCUTANEOUS

## 2020-12-25 MED ORDER — SODIUM CHLORIDE 0.9 % IV SOLN: INTRAVENOUS | Status: AC

## 2020-12-25 MED ORDER — INSULIN ASPART 100 UNIT/ML ~~LOC~~ SOLN: 0.0000 [IU] | Freq: Every day | SUBCUTANEOUS | Status: DC

## 2020-12-25 NOTE — Progress Notes (Signed)
PROGRESS NOTE   Patricia Green  MWU:[id number]    DOB: 1930/05/25    DOA: 12/20/2020  PCP: Soundra Pilon, FNP   I have briefly reviewed patients previous medical records in [facility].  Chief Complaint  Patient presents with  . Fall    Brief Narrative:  87 year old female, lives with her brother and his family, independent and functional at baseline, medical history significant for but not limited to type II DM, left diabetic foot wound (sees podiatry), possible dementia, who presented initially to [facility] ED on 12/20/2020 after she slipped and fell and noted immediate right hip pain.  She had been wearing a boot on her left foot prescribed by the podiatrist and the boot slipped prompting her fall.  Admitted for right hip fracture and DM with hypoglycemia.  Orthopedics was consulted, patient transferred to [facility] and s/p right total hip arthroplasty 4/8.  Hospital course complicated by hypoglycemia, AKI.   Assessment & Plan:  Principal Problem:   Closed subcapital fracture of femur, right, initial encounter [facility]) Active Problems:   Type 2 diabetes mellitus with foot ulcer (HCC)   Hypoglycemia due to insulin   Malnutrition of moderate degree   Acute right hip fracture/right subcapital femoral neck fracture with displacement of fracture fragments, s/p right total hip arthroplasty 4/8 with  Sustained due to mechanical fall at home after her LLE boot slipped on the floor.  Suspect underlying osteoporosis.  Will need vitamin D levels and bone density checked in treated as needed as outpatient.  Orthopedics consulted and patiently underwent right total hip arthroplasty 4/8.  Multimodality pain control, DVT prophylaxis, weightbearing as per orthopedic team.  PT evaluated and recommend SNF.  Hopefully should be able to go to SNF in the next 1 to 2 days orthopedics follow-up appreciated  Remained stable from the standpoint  Acute kidney injury complicating  stage IIIa CKD  Presented with creatinine of 1.09.  No prior results to compare.  Creatinine has gone up to 1.33 >1.23 > 1.33 >1.27 >1.32 >1.41.  Unclear as to why her creatinine is gradually creeping up.  Not sure if she is adequately hydrating herself.  CK normal. Started IV fluids and follow BMP in a.m.  If creatinine not improving or worsening then consider renal ultrasound and may be nephrology consultation.  Avoid nephrotoxics.  Bladder scan without urinary retention.  Type II DM with hypoglycemia, recurrent/hyperglycemia  Hypoglycemia on admission likely from taking insulin without significant oral intake.  Resolved with oral intake and D50 in ED.  A1c 6 indicates tight control in this very elderly female.  Despite placing her back on lower than her home dose of Levemir, patient had hypoglycemic episode with CBG of 57 early morning 4/10.  Held a.m. dose of Levemir and SSI.  Reduced Levemir even further to 5 units twice daily and change SSI to sensitive.  May be related to poor oral intake.  Patient states that she checks CBGs 3 times a day, denies hypoglycemia at home and confirms home dose of Levemir and does not take more than a unit or 2 of SSI  With Levemir 7 units twice daily, CBGs fluctuating and mildly uncontrolled.  Continue current Levemir along with SSI.    Left diabetic foot wound  Outpatient follow-up with podiatry.  WOC input appreciated.  Continue dressing changes per recommendations.  Postop acute blood loss anemia complicating anemia of possible chronic disease  Hemoglobin 11.9 on admission.  No baseline available.  Hemoglobin has  drifted down to 9.3 >8.8 >8.7.  Follow CBCs and transfuse if hemoglobin 7 g or less.  Reactive leukocytosis  Mild.  No infectious etiology suspected.  Improved.  Body mass index is 19.3 kg/m.  Nutritional Status Nutrition Problem: Moderate Malnutrition Etiology: chronic illness,other (see comment) (T2DM,  age) Signs/Symptoms: mild fat depletion,moderate fat depletion,mild muscle depletion,moderate muscle depletion,severe muscle depletion,edema Interventions: MVI,Glucerna shake,Prostat    DVT prophylaxis: enoxaparin (LOVENOX) injection 30 mg Start: 12/22/20 0800 SCDs Start: 12/21/20 1614     Code Status: DNR Family Communication: I discussed in detail with patient's brother via phone on 4/10, updated care and answered all questions.  None at bedside today. Disposition:  Status is: Inpatient  Remains inpatient appropriate because:Inpatient level of care appropriate due to severity of illness   Dispo: The patient is from: Home              Anticipated d/c is to: SNF              Patient currently is not medically stable to d/c.   Difficult to place patient No        Consultants:   Orthopedic  Procedures:   S/p right total hip arthroplasty 4/8  Antimicrobials:    Anti-infectives (From admission, onward)   Start     Dose/Rate Route Frequency Ordered Stop   12/22/20 0100  ceFAZolin (ANCEF) IVPB 2g/100 mL premix        2 g 200 mL/hr over 30 Minutes Intravenous Every 12 hours 12/21/20 1613 12/23/20 0218   12/21/20 1339  vancomycin (VANCOCIN) powder  Status:  Discontinued          As needed 12/21/20 1339 12/21/20 1439   12/21/20 1200  ceFAZolin (ANCEF) IVPB 2g/100 mL premix        2 g 200 mL/hr over 30 Minutes Intravenous On call to O.R. 12/20/20 1336 12/21/20 1320        Subjective:  Denies complaints.  Pain controlled.  Appears to have had a BM today.  As per RN, no acute issues reported.  Objective:   Vitals:   12/24/20 2020 12/25/20 0617 12/25/20 0739 12/25/20 1510  BP: 134/64 (!) 121/53 (!) 138/56 125/69  Pulse: 92 86 87 92  Resp: 16 18 18 18   Temp: 98.3 F (36.8 C) 98.7 F (37.1 C) 98.2 F (36.8 C) 98.6 F (37 C)  TempSrc: Oral Oral Oral Oral  SpO2: 96% 98% 99% 98%  Weight:      Height:        General exam: Pleasant elderly female, moderately built and  frail, sitting up comfortably in reclining chair.  Just finished eating a meal.  Completed 100% of it. Respiratory system: Clear to auscultation.  No increased work of breathing. Cardiovascular system: S1 and S2 heard, RRR.  No JVD, murmurs or pedal edema. Gastrointestinal system: Abdomen is nondistended, soft and nontender. No organomegaly or masses felt. Normal bowel sounds heard. Central nervous system: Alert and oriented.  No focal neurological deficits. Extremities: Symmetric 5 x 5 power.  Right hip/groin surgical site dressing clean and dry Skin: No rashes, lesions or ulcers Psychiatry: Judgement and insight intact. Mood & affect pleasant and appropriate.     Data Reviewed:   I have personally reviewed following labs and imaging studies   CBC: Recent Labs  Lab 12/23/20 0220 12/24/20 0223 12/25/20 0138  WBC 22.4* 16.5* 12.9*  HGB 9.3* 8.8* 8.7*  HCT 27.5* 26.1* 25.8*  MCV 90.5 88.8 92.8  PLT 160 156 196  Basic Metabolic Panel: Recent Labs  Lab 12/23/20 0220 12/24/20 0223 12/25/20 0138  NA 136 135 135  K 3.6 4.1 3.9  CL 106 104 104  CO2 25 24 27   GLUCOSE 92 191* 178*  BUN 22 31* 34*  CREATININE 1.27* 1.32* 1.41*  CALCIUM 8.1* 8.0* 7.9*    Liver Function Tests: No results for input(s): AST, ALT, ALKPHOS, BILITOT, PROT, ALBUMIN in the last 168 hours.  CBG: Recent Labs  Lab 12/25/20 0747 12/25/20 1153 12/25/20 1509  GLUCAP 147* 256* 211*    Microbiology Studies:   Recent Results (from the past 240 hour(s))  Resp Panel by RT-PCR (Flu A&B, Covid) Nasopharyngeal Swab     Status: None   Collection Time: 12/20/20 10:39 AM   Specimen: Nasopharyngeal Swab; Nasopharyngeal(NP) swabs in vial transport medium  Result Value Ref Range Status   SARS Coronavirus 2 by RT PCR NEGATIVE NEGATIVE Final    Comment: (NOTE) SARS-CoV-2 target nucleic acids are NOT DETECTED.  The SARS-CoV-2 RNA is generally detectable in upper respiratory specimens during the acute phase  of infection. The lowest concentration of SARS-CoV-2 viral copies this assay can detect is 138 copies/mL. A negative result does not preclude SARS-Cov-2 infection and should not be used as the sole basis for treatment or other patient management decisions. A negative result may occur with  improper specimen collection/handling, submission of specimen other than nasopharyngeal swab, presence of viral mutation(s) within the areas targeted by this assay, and inadequate number of viral copies(<138 copies/mL). A negative result must be combined with clinical observations, patient history, and epidemiological information. The expected result is Negative.  Fact Sheet for Patients:  02/19/21  Fact Sheet for Healthcare Providers:  BloggerCourse.com  This test is no t yet approved or cleared by the [employer].[place] FDA and  has been authorized for detection and/or diagnosis of SARS-CoV-2 by FDA under an Emergency Use Authorization (EUA). This EUA will remain  in effect (meaning this test can be used) for the duration of the COVID-19 declaration under Section 564(b)(1) of the Act, 21 U.S.C.section 360bbb-3(b)(1), unless the authorization is terminated  or revoked sooner.       Influenza A by PCR NEGATIVE NEGATIVE Final   Influenza B by PCR NEGATIVE NEGATIVE Final    Comment: (NOTE) The Xpert Xpress SARS-CoV-2/FLU/RSV plus assay is intended as an aid in the diagnosis of influenza from Nasopharyngeal swab specimens and should not be used as a sole basis for treatment. Nasal washings and aspirates are unacceptable for Xpert Xpress SARS-CoV-2/FLU/RSV testing.  Fact Sheet for Patients: [place]  Fact Sheet for Healthcare Providers: BloggerCourse.com  This test is not yet approved or cleared by the SeriousBroker.it FDA and has been authorized for detection and/or diagnosis of  SARS-CoV-2 by FDA under an Emergency Use Authorization (EUA). This EUA will remain in effect (meaning this test can be used) for the duration of the COVID-19 declaration under Section 564(b)(1) of the Act, 21 U.S.C. section 360bbb-3(b)(1), unless the authorization is terminated or revoked.  Performed at [facility], [address], [place], [place]   Surgical pcr screen     Status: None   Collection Time: 12/20/20  5:46 PM   Specimen: Nasal Mucosa; Nasal Swab  Result Value Ref Range Status   MRSA, PCR NEGATIVE NEGATIVE Final   Staphylococcus aureus NEGATIVE NEGATIVE Final    Comment: (NOTE) The Xpert SA Assay (FDA approved for NASAL specimens in patients 55 years of age and older), is one  component of a comprehensive surveillance program. It is not intended to diagnose infection nor to guide or monitor treatment. Performed at [facility], [address], [place], [place] [address]      Radiology Studies:  No results found.   Scheduled Meds:   . (feeding supplement) PROSource Plus  30 mL Oral BID BM  . aspirin  81 mg Oral QHS  . docusate sodium  100 mg Oral BID  . enoxaparin (LOVENOX) injection  30 mg Subcutaneous Q24H  . feeding supplement (GLUCERNA SHAKE)  237 mL Oral TID BM  . insulin aspart  0-9 Units Subcutaneous TID WC  . insulin detemir  7 Units Subcutaneous BID  . multivitamin with minerals  1 tablet Oral Daily  . polyethylene glycol  17 g Oral Daily    Continuous Infusions:   . sodium chloride 75 mL/hr at 12/25/20 1433  . methocarbamol (ROBAXIN) IV       LOS: 5 days     Marcellus Scott, MD, [place], [facility]. [facility]    To contact the attending provider between 7A-7P or the covering provider during after hours 7P-7A, please log into the web site [email or web address] and access using universal [place] password for that web site. If you do not have the password, please call the hospital operator.  12/25/2020, 4:53  PM

## 2020-12-25 NOTE — Progress Notes (Signed)
Occupational Therapy Treatment Patient Details Name: Keymiah Lyles MRN: [id number] DOB: 07/22/1930 Today's Date: 12/25/2020    History of present illness pt is 87 year old female with a history of diabetes, left diabetic foot wound (sees podiatry) who presented to the ED s/p slip and fall with right hip pain at home, x-rays showed a right hip fx; now s/p THA anterior approach.   OT comments  Pt was on toilet upon arrival, nurse tech communicated that she was min guard/min A to ambulate to toilet and stand>sit on toilet. Pt completed toileting needs and competed sit>stand with min guard for safety and moderate vc's for body positioning. Completed grooming at the sink with min guard. Pt ambulated within room with rw given min guard for safety. Pt received education on strategies to decrease fall risk, pt used teach back to demonstrate understanding. OT to continue to follow acutely, d/c plan remains appropriate.    Follow Up Recommendations  SNF;Supervision/Assistance - 24 hour    Equipment Recommendations  3 in 1 bedside commode;Tub/shower bench;Other (comment)       Precautions / Restrictions Precautions Precautions: Anterior Hip Precaution Booklet Issued: No Precaution Comments: WBAT - R LE Restrictions Weight Bearing Restrictions: No RUE Weight Bearing: Weight bearing as tolerated       Mobility     Transfers Overall transfer level: Needs assistance Equipment used: Rolling walker (2 wheeled)   Sit to Stand: Min assist Stand pivot transfers: Min guard       General transfer comment: Min A to lift, once standing with rw pt is min guard for safety    Balance   Sitting-balance support: Feet supported;No upper extremity supported Sitting balance-Leahy Scale: Good     Standing balance support: Bilateral upper extremity supported Standing balance-Leahy Scale: Fair Standing balance comment: Pt stood statically for ~1 minute without external support, pt also groomed at the  sink withhip supported on sink ledge              ADL either performed or assessed with clinical judgement   ADL Overall ADL's : Needs assistance/impaired Eating/Feeding: Set up   Grooming: Wash/dry hands;Minimal assistance           Toilet Transfer: Minimal assistance;RW;Cueing for Chief of Staff Details (indicate cue type and reason): Cue for hand placement Toileting- Clothing Manipulation and Hygiene: Min guard;Cueing for safety;Sitting/lateral lean       Functional mobility during ADLs: Min guard;Rolling walker;Cueing for safety General ADL Comments: pt able to comlpete fucntnional mobility and ADLs with rw with min guard A for safety and verbal cues for proper body positioning.               Cognition Arousal/Alertness: Awake/alert Behavior During Therapy: WFL for tasks assessed/performed Overall Cognitive Status: Within Functional Limits for tasks assessed                         General Comments Pt with IV placement in LUE    Pertinent Vitals/ Pain       Pain Assessment: No/denies pain         Frequency  Min 2X/week        Progress Toward Goals  OT Goals(current goals can now be found in the care plan section)  Progress towards OT goals: Progressing toward goals  Acute Rehab OT Goals Patient Stated Goal: to be able to go home and be indep OT Goal Formulation: With patient Time For Goal Achievement: 01/05/21 Potential to Achieve  Goals: Good ADL Goals Pt Will Perform Lower Body Bathing: with modified independence Pt Will Perform Lower Body Dressing: with modified independence Pt Will Transfer to Toilet: with modified independence Pt Will Perform Tub/Shower Transfer: with modified independence Pt/caregiver will Perform Home Exercise Program: Both right and left upper extremity;Increased strength;With theraband;With Supervision;With written HEP provided  Plan Discharge plan remains appropriate       AM-PAC OT "6 Clicks" Daily  Activity     Outcome Measure   Help from another person eating meals?: None Help from another person taking care of personal grooming?: A Little Help from another person toileting, which includes using toliet, bedpan, or urinal?: A Little Help from another person bathing (including washing, rinsing, drying)?: A Little Help from another person to put on and taking off regular upper body clothing?: A Little Help from another person to put on and taking off regular lower body clothing?: A Little 6 Click Score: 19    End of Session Equipment Utilized During Treatment: Gait belt;Rolling walker  OT Visit Diagnosis: Unsteadiness on feet (R26.81)   Activity Tolerance Patient tolerated treatment well   Patient Left in chair;with chair alarm set;with family/visitor present;with call bell/phone within reach   Nurse Communication Mobility status;Weight bearing status;Precautions        Time: 1423-1440 OT Time Calculation (min): 17 min  Charges: OT General Charges $OT Visit: 1 Visit OT Treatments $Self Care/Home Management : 8-22 mins    Mariyam Remington A Shterna Laramee 12/25/2020, 2:48 PM

## 2020-12-25 NOTE — Plan of Care (Signed)

## 2020-12-25 NOTE — Progress Notes (Signed)
Physical Therapy Treatment Patient Details Name: Patricia Green MRN: [id number] DOB: July 09, 1930 Today's Date: 12/25/2020    History of Present Illness pt is 87 year old female who presented to the ED s/p slip and fall with right hip pain at home, x-rays showed a right hip fx; now s/p THA anterior approach. PMH: diabetes, left diabetic foot wound (sees podiatry)    PT Comments    Pt received in chair, agreeable to therapy session and with good tolerance for gait training with chair follow for safety. Pt reporting moderate to severe RLE discomfort but able to progress gait distance with RW and up to minA needed for transfer training. Pt VSS on RA. Will plan to given HEP handout and initiate stair training next session if able. Pt continues to benefit from PT services to progress toward functional mobility goals. Continue to recommend SNF.  Follow Up Recommendations  SNF;Supervision/Assistance - 24 hour     Equipment Recommendations  Rolling walker with 5" wheels    Recommendations for Other Services       Precautions / Restrictions Precautions Precautions: Fall Precaution Booklet Issued: No Precaution Comments: per op note no hip precautions Restrictions Weight Bearing Restrictions: Yes RLE Weight Bearing: Weight bearing as tolerated    Mobility  Bed Mobility    General bed mobility comments: received and remained in recliner chair    Transfers Overall transfer level: Needs assistance Equipment used: Rolling walker (2 wheeled)   Sit to Stand: Min assist Stand pivot transfers: Min guard       General transfer comment: Min A to lift, once standing with RW pt is min guard for safety, mod cues for sequencing safely; minA for controlled lowering back to chair  Ambulation/Gait Ambulation/Gait assistance: Min guard;+2 safety/equipment Gait Distance (Feet): 60 Feet (x2 with seated break/chair follow) Assistive device: Rolling walker (2 wheeled) Gait Pattern/deviations:  Step-through pattern;Decreased stride length;Step-to pattern;Antalgic Gait velocity: grossly <0.3 m/s Gait velocity interpretation: <1.8 ft/sec, indicate of risk for recurrent falls General Gait Details: pt initially with step to pattern progressing to short step through pattern.   Stairs             Wheelchair Mobility    Modified Rankin (Stroke Patients Only)       Balance   Sitting-balance support: Feet supported;No upper extremity supported Sitting balance-Leahy Scale: Good     Standing balance support: Bilateral upper extremity supported Standing balance-Leahy Scale: Fair Standing balance comment: able to stand and don mask unsupported ~10-15 seconds but needs minA for balance while performing; able to stand with RW and min guard                            Cognition Arousal/Alertness: Awake/alert Behavior During Therapy: WFL for tasks assessed/performed Overall Cognitive Status: Within Functional Limits for tasks assessed                                        Exercises General Exercises - Lower Extremity Ankle Circles/Pumps: AROM;Both;5 reps;Seated Long Arc Quad: AROM;Right;10 reps;Seated Hip Flexion/Marching: AROM;Right;10 reps;Seated    General Comments General comments (skin integrity, edema, etc.): pt pulling 1750-2,000 on IS x5 reps, encouraged hourly use; VSS on RA      Pertinent Vitals/Pain Pain Assessment: Faces Faces Pain Scale: Hurts even more Pain Location: R LE with movement Pain Descriptors / Indicators: Sore Pain Intervention(s):  Monitored during session;Repositioned;RN gave pain meds during session    Home Living                      Prior Function            PT Goals (current goals can now be found in the care plan section) Acute Rehab PT Goals Patient Stated Goal: to be able to go home and be indep PT Goal Formulation: With patient Time For Goal Achievement: 01/05/21 Potential to Achieve  Goals: Good Progress towards PT goals: Progressing toward goals    Frequency    Min 3X/week      PT Plan Current plan remains appropriate    Co-evaluation              AM-PAC PT "6 Clicks" Mobility   Outcome Measure  Help needed turning from your back to your side while in a flat bed without using bedrails?: A Little Help needed moving from lying on your back to sitting on the side of a flat bed without using bedrails?: A Little Help needed moving to and from a bed to a chair (including a wheelchair)?: A Little Help needed standing up from a chair using your arms (e.g., wheelchair or bedside chair)?: A Little Help needed to walk in hospital room?: A Little Help needed climbing 3-5 steps with a railing? : A Lot 6 Click Score: 17    End of Session Equipment Utilized During Treatment: Gait belt Activity Tolerance: Patient tolerated treatment well Patient left: in chair;with call bell/phone within reach;with chair alarm set Nurse Communication: Mobility status;Patient requests pain meds PT Visit Diagnosis: Unsteadiness on feet (R26.81);Other abnormalities of gait and mobility (R26.89);Muscle weakness (generalized) (M62.81);Pain Pain - Right/Left: Right Pain - part of body: Hip     Time: 5885-0277 PT Time Calculation (min) (ACUTE ONLY): 12 min  Charges:  $Gait Training: 8-22 mins                     Patricia Green P., PTA Acute Rehabilitation Services Pager: [phone] Office: [phone]   Angus Palms 12/25/2020, 4:45 PM

## 2020-12-26 LAB — CBC
HCT: 25.9 % — ABNORMAL LOW (ref 36.0–46.0)
Hemoglobin: 8.4 g/dL — ABNORMAL LOW (ref 12.0–15.0)
MCH: 30.9 pg (ref 26.0–34.0)
MCHC: 32.4 g/dL (ref 30.0–36.0)
MCV: 95.2 fL (ref 80.0–100.0)
Platelets: 236 10*3/uL (ref 150–400)
RBC: 2.72 MIL/uL — ABNORMAL LOW (ref 3.87–5.11)
RDW: 12.9 % (ref 11.5–15.5)
WBC: 12.2 10*3/uL — ABNORMAL HIGH (ref 4.0–10.5)
nRBC: 0 % (ref 0.0–0.2)

## 2020-12-26 LAB — BASIC METABOLIC PANEL
Anion gap: 5 (ref 5–15)
BUN: 29 mg/dL — ABNORMAL HIGH (ref 8–23)
CO2: 25 mmol/L (ref 22–32)
Calcium: 7.8 mg/dL — ABNORMAL LOW (ref 8.9–10.3)
Chloride: 108 mmol/L (ref 98–111)
Creatinine, Ser: 1.42 mg/dL — ABNORMAL HIGH (ref 0.44–1.00)
GFR, Estimated: 36 mL/min — ABNORMAL LOW (ref >60)
Glucose, Bld: 110 mg/dL — ABNORMAL HIGH (ref 70–99)
Potassium: 4.5 mmol/L (ref 3.5–5.1)
Sodium: 138 mmol/L (ref 135–145)

## 2020-12-26 LAB — GLUCOSE, CAPILLARY
Glucose-Capillary: 109 mg/dL — ABNORMAL HIGH (ref 70–99)
Glucose-Capillary: 200 mg/dL — ABNORMAL HIGH (ref 70–99)
Glucose-Capillary: 158 mg/dL — ABNORMAL HIGH (ref 70–99)
Glucose-Capillary: 187 mg/dL — ABNORMAL HIGH (ref 70–99)

## 2020-12-26 NOTE — Care Management Important Message (Signed)
Important Message  Patient Details  Name: Patricia Green MRN: [id number] Date of Birth: 06/02/1930   Medicare Important Message Given:  Yes     Ashleynicole Mcclees Stefan Church 12/26/2020, 2:53 PM

## 2020-12-26 NOTE — Progress Notes (Signed)
PROGRESS NOTE    Patricia Green  VEH:[id number] DOB: 13-Jun-1930 DOA: 12/20/2020 PCP: Soundra Pilon, FNP   Brief Narrative: Patricia Green is a 87 y.o. female with a history of type II DM, left diabetic foot wound (sees podiatry), possible dementia. Patient presented secondary to slip and fall resulting in right femur fracture. Orthopedic surgery consulted for surgical repair.   Assessment & Plan:   Principal Problem:   Closed subcapital fracture of femur, right, initial encounter [facility]) Active Problems:   Type 2 diabetes mellitus with foot ulcer (HCC)   Hypoglycemia due to insulin   Malnutrition of moderate degree   Acute right subcapital femoral neck fracture Secondary to slip and fall. Patient is s/p total hip arthroplasty on 4/8. PT/OT recommending SNF. -Orthopedic surgery recommendations  AKI on CKD stage IIIa Possibly related to poor oral intake. No known baseline but creatinine of 1.09 on admission. Trended up with a peak of 1.42. IV fluids started -BMP in AM  Diabetes mellitus, type 2 Patient with episodes of hypo- and hyperglycemia. Given D50 for hypoglycemia. She is on Levemir as an outpatient and her hemoglobin A1C is 6.0%. Levemir dosing decreased to 7 units BID -Continue Levemir and SSI  Left diabetic foot wound Outpatient podiatry follow-up. Wound care consulted inpatient.  Acute blood loss anemia Secondary to likely perioperative blood loss. Hemoglobin drifting down. No evidence of active bleeding. -CBC in AM  Leukocytosis Reactive. Improved.  Moderate malnutrition Dietitian recommendations:  Glucerna Shake po TID, each supplement provides 220 kcal and 10 grams of protein.  30 ml ProSource Plus po BID, each supplement provides 100 kcal and 15 grams of protein.   MVI with minerals daily.   DVT prophylaxis: Aspirin/Lovenox Code Status:   Code Status: DNR Family Communication: None at bedside Disposition Plan: Discharge to SNF likely in 24 hours if  hemoglobin and creatinine stable   Consultants:   Orthopedic surgery  Procedures:   RIGHT TOTAL HIP ARTHROPLASTY (12/21/2020)  Antimicrobials:  None    Subjective: No concerns this morning. No issues overnight.  Objective: Vitals:   12/25/20 1946 12/26/20 0500 12/26/20 0721 12/26/20 1922  BP: (!) 111/53 (!) 116/55 (!) 104/40 (!) 151/81  Pulse: 86 79 78 95  Resp: 16 16 15 16   Temp: 98 F (36.7 C) 98.3 F (36.8 C) (!) 97.2 F (36.2 C) 98.5 F (36.9 C)  TempSrc: Oral Oral Oral Oral  SpO2: 100% 100% 99% 100%  Weight:      Height:        Intake/Output Summary (Last 24 hours) at 12/26/2020 1930 Last data filed at 12/26/2020 1500 Gross per 24 hour  Intake 480 ml  Output --  Net 480 ml   Filed Weights   12/21/20 1130  Weight: 52.6 kg    Examination:  General exam: Appears calm and comfortable Respiratory system: Clear to auscultation. Respiratory effort normal. Cardiovascular system: S1 & S2 heard, RRR. No murmurs, rubs, gallops or clicks. Gastrointestinal system: Abdomen is nondistended, soft and nontender. No organomegaly or masses felt. Normal bowel sounds heard. Central nervous system: Alert and oriented. No focal neurological deficits. Musculoskeletal: No edema. No calf tenderness Skin: No cyanosis. No new rashes Psychiatry: Judgement and insight appear normal. Mood & affect appropriate.     Data Reviewed: I have personally reviewed following labs and imaging studies  CBC Lab Results  Component Value Date   WBC 12.2 (H) 12/26/2020   RBC 2.72 (L) 12/26/2020   HGB 8.4 (L) 12/26/2020   HCT 25.9 (  L) 12/26/2020   MCV 95.2 12/26/2020   MCH 30.9 12/26/2020   PLT 236 12/26/2020   MCHC 32.4 12/26/2020   RDW 12.9 12/26/2020     Last metabolic panel Lab Results  Component Value Date   NA 138 12/26/2020   K 4.5 12/26/2020   CL 108 12/26/2020   CO2 25 12/26/2020   BUN 29 (H) 12/26/2020   CREATININE 1.42 (H) 12/26/2020   GLUCOSE 110 (H) 12/26/2020    GFRNONAA 36 (L) 12/26/2020   CALCIUM 7.8 (L) 12/26/2020   ANIONGAP 5 12/26/2020    CBG (last 3)  Recent Labs    12/26/20 1126 12/26/20 1629 12/26/20 1926  GLUCAP 200* 158* 187*     GFR: Estimated Creatinine Clearance: 22.7 mL/min (A) (by C-G formula based on SCr of 1.42 mg/dL (H)).  Coagulation Profile: No results for input(s): INR, PROTIME in the last 168 hours.  Recent Results (from the past 240 hour(s))  Resp Panel by RT-PCR (Flu A&B, Covid) Nasopharyngeal Swab     Status: None   Collection Time: 12/20/20 10:39 AM   Specimen: Nasopharyngeal Swab; Nasopharyngeal(NP) swabs in vial transport medium  Result Value Ref Range Status   SARS Coronavirus 2 by RT PCR NEGATIVE NEGATIVE Final    Comment: (NOTE) SARS-CoV-2 target nucleic acids are NOT DETECTED.  The SARS-CoV-2 RNA is generally detectable in upper respiratory specimens during the acute phase of infection. The lowest concentration of SARS-CoV-2 viral copies this assay can detect is 138 copies/mL. A negative result does not preclude SARS-Cov-2 infection and should not be used as the sole basis for treatment or other patient management decisions. A negative result may occur with  improper specimen collection/handling, submission of specimen other than nasopharyngeal swab, presence of viral mutation(s) within the areas targeted by this assay, and inadequate number of viral copies(<138 copies/mL). A negative result must be combined with clinical observations, patient history, and epidemiological information. The expected result is Negative.  Fact Sheet for Patients:  BloggerCourse.com  Fact Sheet for Healthcare Providers:  SeriousBroker.it  This test is no t yet approved or cleared by the [place] FDA and  has been authorized for detection and/or diagnosis of SARS-CoV-2 by FDA under an Emergency Use Authorization (EUA). This EUA will remain  in effect (meaning  this test can be used) for the duration of the COVID-19 declaration under Section 564(b)(1) of the Act, 21 U.S.C.section 360bbb-3(b)(1), unless the authorization is terminated  or revoked sooner.       Influenza A by PCR NEGATIVE NEGATIVE Final   Influenza B by PCR NEGATIVE NEGATIVE Final    Comment: (NOTE) The Xpert Xpress SARS-CoV-2/FLU/RSV plus assay is intended as an aid in the diagnosis of influenza from Nasopharyngeal swab specimens and should not be used as a sole basis for treatment. Nasal washings and aspirates are unacceptable for Xpert Xpress SARS-CoV-2/FLU/RSV testing.  Fact Sheet for Patients: BloggerCourse.com  Fact Sheet for Healthcare Providers: SeriousBroker.it  This test is not yet approved or cleared by the [place] FDA and has been authorized for detection and/or diagnosis of SARS-CoV-2 by FDA under an Emergency Use Authorization (EUA). This EUA will remain in effect (meaning this test can be used) for the duration of the COVID-19 declaration under Section 564(b)(1) of the Act, 21 U.S.C. section 360bbb-3(b)(1), unless the authorization is terminated or revoked.  Performed at [facility], [address] [place], [place] [address]   Surgical pcr screen     Status: None   Collection  Time: 12/20/20  5:46 PM   Specimen: Nasal Mucosa; Nasal Swab  Result Value Ref Range Status   MRSA, PCR NEGATIVE NEGATIVE Final   Staphylococcus aureus NEGATIVE NEGATIVE Final    Comment: (NOTE) The Xpert SA Assay (FDA approved for NASAL specimens in patients 61 years of age and older), is one component of a comprehensive surveillance program. It is not intended to diagnose infection nor to guide or monitor treatment. Performed at [facility], [address], [place], [place] [address]         Radiology Studies: No results found.      Scheduled Meds: . (feeding supplement)  PROSource Plus  30 mL Oral BID BM  . aspirin  81 mg Oral QHS  . docusate sodium  100 mg Oral BID  . enoxaparin (LOVENOX) injection  30 mg Subcutaneous Q24H  . feeding supplement (GLUCERNA SHAKE)  237 mL Oral TID BM  . insulin aspart  0-5 Units Subcutaneous QHS  . insulin aspart  0-9 Units Subcutaneous TID WC  . insulin detemir  7 Units Subcutaneous BID  . multivitamin with minerals  1 tablet Oral Daily  . polyethylene glycol  17 g Oral Daily   Continuous Infusions: . methocarbamol (ROBAXIN) IV       LOS: 6 days     Jacquelin Hawking, MD [facility] 12/26/2020, 7:30 PM  If 7PM-7AM, please contact night-coverage [email or web address]

## 2020-12-26 NOTE — TOC Progression Note (Signed)
Transition of Care [facility]) - Progression Note    Patient Details  Name: Patricia Green MRN: [id number] Date of Birth: 1929-10-17  Transition of Care [facility]) CM/SW Contact  Epifanio Lesches, RN Phone Number: 12/26/2020, 9:52 AM  Clinical Narrative:    SNF authorization still pending for placement @ [facility].  TOC team monitoring and will continue assisting with TOC needs.   Expected Discharge Plan: Skilled Nursing Facility [facility]) Barriers to Discharge: English as a second language teacher  Expected Discharge Plan and Services Expected Discharge Plan: Skilled Nursing Facility [facility]) In-house Referral: Clinical Social Work     Living arrangements for the past 2 months: Single Family Home                                       Social Determinants of Health (SDOH) Interventions    Readmission Risk Interventions No flowsheet data found.

## 2020-12-26 NOTE — Plan of Care (Signed)

## 2020-12-27 DIAGNOSIS — E440 Moderate protein-calorie malnutrition: Secondary | ICD-10-CM | POA: Diagnosis not present

## 2020-12-27 DIAGNOSIS — K5900 Constipation, unspecified: Secondary | ICD-10-CM | POA: Diagnosis not present

## 2020-12-27 DIAGNOSIS — S79929A Unspecified injury of unspecified thigh, initial encounter: Secondary | ICD-10-CM | POA: Diagnosis not present

## 2020-12-27 DIAGNOSIS — N183 Chronic kidney disease, stage 3 unspecified: Secondary | ICD-10-CM

## 2020-12-27 DIAGNOSIS — Z8631 Personal history of diabetic foot ulcer: Secondary | ICD-10-CM | POA: Diagnosis not present

## 2020-12-27 DIAGNOSIS — E118 Type 2 diabetes mellitus with unspecified complications: Secondary | ICD-10-CM | POA: Diagnosis not present

## 2020-12-27 DIAGNOSIS — M6281 Muscle weakness (generalized): Secondary | ICD-10-CM | POA: Diagnosis not present

## 2020-12-27 DIAGNOSIS — S728X1D Other fracture of right femur, subsequent encounter for closed fracture with routine healing: Secondary | ICD-10-CM | POA: Diagnosis not present

## 2020-12-27 DIAGNOSIS — R2681 Unsteadiness on feet: Secondary | ICD-10-CM | POA: Diagnosis not present

## 2020-12-27 DIAGNOSIS — N179 Acute kidney failure, unspecified: Secondary | ICD-10-CM | POA: Diagnosis not present

## 2020-12-27 DIAGNOSIS — M2550 Pain in unspecified joint: Secondary | ICD-10-CM | POA: Diagnosis not present

## 2020-12-27 DIAGNOSIS — E11621 Type 2 diabetes mellitus with foot ulcer: Secondary | ICD-10-CM | POA: Diagnosis not present

## 2020-12-27 DIAGNOSIS — T383X5A Adverse effect of insulin and oral hypoglycemic [antidiabetic] drugs, initial encounter: Secondary | ICD-10-CM | POA: Diagnosis not present

## 2020-12-27 DIAGNOSIS — Z7401 Bed confinement status: Secondary | ICD-10-CM | POA: Diagnosis not present

## 2020-12-27 DIAGNOSIS — N1831 Chronic kidney disease, stage 3a: Secondary | ICD-10-CM | POA: Diagnosis not present

## 2020-12-27 DIAGNOSIS — E160 Drug-induced hypoglycemia without coma: Secondary | ICD-10-CM | POA: Diagnosis not present

## 2020-12-27 DIAGNOSIS — D62 Acute posthemorrhagic anemia: Secondary | ICD-10-CM | POA: Diagnosis not present

## 2020-12-27 DIAGNOSIS — L97509 Non-pressure chronic ulcer of other part of unspecified foot with unspecified severity: Secondary | ICD-10-CM | POA: Diagnosis not present

## 2020-12-27 DIAGNOSIS — E46 Unspecified protein-calorie malnutrition: Secondary | ICD-10-CM | POA: Diagnosis not present

## 2020-12-27 DIAGNOSIS — R296 Repeated falls: Secondary | ICD-10-CM | POA: Diagnosis not present

## 2020-12-27 DIAGNOSIS — D72829 Elevated white blood cell count, unspecified: Secondary | ICD-10-CM | POA: Diagnosis not present

## 2020-12-27 DIAGNOSIS — Z794 Long term (current) use of insulin: Secondary | ICD-10-CM | POA: Diagnosis not present

## 2020-12-27 DIAGNOSIS — S72011A Unspecified intracapsular fracture of right femur, initial encounter for closed fracture: Secondary | ICD-10-CM | POA: Diagnosis not present

## 2020-12-27 DIAGNOSIS — S91302A Unspecified open wound, left foot, initial encounter: Secondary | ICD-10-CM | POA: Diagnosis not present

## 2020-12-27 DIAGNOSIS — I959 Hypotension, unspecified: Secondary | ICD-10-CM | POA: Diagnosis not present

## 2020-12-27 DIAGNOSIS — E1121 Type 2 diabetes mellitus with diabetic nephropathy: Secondary | ICD-10-CM | POA: Diagnosis not present

## 2020-12-27 LAB — BASIC METABOLIC PANEL
Anion gap: 6 (ref 5–15)
BUN: 27 mg/dL — ABNORMAL HIGH (ref 8–23)
CO2: 28 mmol/L (ref 22–32)
Calcium: 8.3 mg/dL — ABNORMAL LOW (ref 8.9–10.3)
Chloride: 102 mmol/L (ref 98–111)
Creatinine, Ser: 1.29 mg/dL — ABNORMAL HIGH (ref 0.44–1.00)
GFR, Estimated: 40 mL/min — ABNORMAL LOW (ref >60)
Glucose, Bld: 253 mg/dL — ABNORMAL HIGH (ref 70–99)
Potassium: 4.2 mmol/L (ref 3.5–5.1)
Sodium: 136 mmol/L (ref 135–145)

## 2020-12-27 LAB — RESP PANEL BY RT-PCR (FLU A&B, COVID) ARPGX2
Influenza A by PCR: NEGATIVE
Influenza B by PCR: NEGATIVE
SARS Coronavirus 2 by RT PCR: NEGATIVE

## 2020-12-27 LAB — GLUCOSE, CAPILLARY
Glucose-Capillary: 106 mg/dL — ABNORMAL HIGH (ref 70–99)
Glucose-Capillary: 227 mg/dL — ABNORMAL HIGH (ref 70–99)

## 2020-12-27 MED ORDER — GLUCERNA SHAKE PO LIQD: 237.0000 mL | Freq: Three times a day (TID) | ORAL | 0 refills | Status: AC

## 2020-12-27 MED ORDER — INSULIN DETEMIR 100 UNIT/ML ~~LOC~~ SOLN: 7.0000 [IU] | Freq: Two times a day (BID) | SUBCUTANEOUS | Status: AC

## 2020-12-27 MED ORDER — PROSOURCE PLUS PO LIQD: 30.0000 mL | Freq: Two times a day (BID) | ORAL | Status: AC

## 2020-12-27 MED ORDER — DOCUSATE SODIUM 100 MG PO CAPS: 100.0000 mg | ORAL_CAPSULE | Freq: Two times a day (BID) | ORAL | 0 refills | Status: AC

## 2020-12-27 MED ORDER — POLYETHYLENE GLYCOL 3350 17 G PO PACK
17.0000 g | PACK | Freq: Every day | ORAL | 0 refills | Status: AC
Start: 2020-12-27 — End: ?

## 2020-12-27 NOTE — TOC Transition Note (Signed)
Transition of Care [facility]) - CM/SW Discharge Note   Patient Details  Name: Patricia Green MRN: [id number] Date of Birth: 1930/03/05  Transition of Care [facility]) CM/SW Contact:  Epifanio Lesches, RN Phone Number: 12/27/2020, 9:54 AM   Clinical Narrative:    Patient will DC to: Home  Anticipated DC date: 12/27/2020 Family notified: yes Transport by: Sharin Mons   -  S/p Total hip replacement; Right hip, 12/21/2020  Per MD patient ready for DC today. RN, patient, patient's family, and facility notified of DC. Discharge Summary and FL2 sent to facility. RN to call report prior to discharge [phone]). DC packet on chart. Ambulance transport requested for patient.   RNCM will sign off for now as intervention is no longer needed. Please consult [place] again if new needs arise.    Final next level of care: Skilled Nursing Facility [facility]) Barriers to Discharge: No Barriers Identified   Patient Goals and CMS Choice Patient states their goals for this hospitalization and ongoing recovery are:: " to get stronger and to be able to walk again" CMS Medicare.gov Compare Post Acute Care list provided to:: Patient Choice offered to / list presented to : Patient  Discharge Placement                       Discharge Plan and Services In-house Referral: Clinical Social Work                                   Social Determinants of Health (SDOH) Interventions     Readmission Risk Interventions No flowsheet data found.

## 2020-12-27 NOTE — Discharge Summary (Signed)
Physician Discharge Summary  Patricia Green PZW:[id number] DOB: 07-26-1930 DOA: 12/20/2020  PCP: Soundra Pilon, FNP  Admit date: 12/20/2020 Discharge date: 12/27/2020  Admitted From: Home Disposition: SNF  Recommendations for Outpatient Follow-up:  1. Follow up with PCP in 1 week 2. Follow up with orthopedic surgery in 2 weeks 3. Please obtain BMP/CBC in one week 4. Please follow up on the following pending results: None   Discharge Condition: Stable CODE STATUS: DNR Diet recommendation: Heart healthy/Carb modified   Brief/Interim Summary:  Admission HPI written by Jae Dire, MD   HPI:  This is an 87 year old female with a history of diabetes, left diabetic foot wound (sees podiatry) who presented to the ED s/p slip and fall with right hip pain at home prior to arrival today. Patient states that she has been wearing a boot prescribed by her podiatrist for her left foot wound. The boot slipped this morning on the floor prompting her to fall.   No loss of consciousness or head injury.  No neck, back, chest pain or shortness of breath.  No other issues.  Not on anticoagulants. Of note, she did take her insulin this morning but did not eat as she came to the ED.   Hospital course:  Acute right subcapital femoral neck fracture Secondary to slip and fall. Patient is s/p total hip arthroplasty on 4/8. PT/OT recommending SNF. Weight bearing as tolerated. Outpatient orthopedic surgery follow-up for suture removal.  AKI on CKD stage IIIa Possibly related to poor oral intake. No known baseline but creatinine of 1.09 on admission. Trended up with a peak of 1.42. IV fluids started with improvement. Repeat BMP as an outpatient.  Diabetes mellitus, type 2 Patient with episodes of hypo- and hyperglycemia. Given D50 for hypoglycemia. She is on Levemir as an outpatient and her hemoglobin A1C is 6.0%. Levemir dosing decreased to 7 units BID which will continue on discharge.  Left  diabetic foot wound Outpatient podiatry follow-up. Wound care consulted inpatient.  Acute blood loss anemia Secondary to likely perioperative blood loss. Hemoglobin drifted down. No evidence of active bleeding. Repeat CBC as an outpatient.  Leukocytosis Reactive. Improved.  Moderate malnutrition Dietitian recommendations:  Glucerna Shake po TID, each supplement provides 220 kcal and 10 grams of protein.  30 ml ProSource Pluspo BID, each supplement provides 100 kcal and 15 grams of protein.   MVI with minerals daily.   Discharge Diagnoses:  Principal Problem:   Closed subcapital fracture of femur, right, initial encounter (HCC) Active Problems:   Type 2 diabetes mellitus with foot ulcer (HCC)   Hypoglycemia due to insulin   Malnutrition of moderate degree   AKI (acute kidney injury) (HCC)   CKD (chronic kidney disease), stage III [facility])    Discharge Instructions  Discharge Instructions    Call MD for:  redness, tenderness, or signs of infection (pain, swelling, redness, odor or green/yellow discharge around incision site)   Complete by: As directed    Call MD for:  severe uncontrolled pain   Complete by: As directed    Call MD for:  temperature >100.4   Complete by: As directed    Diet - low sodium heart healthy   Complete by: As directed    Discharge wound care:   Complete by: As directed    Cleanse left lateral foot wound with saline. Place a small piece of Aquacel [place] [phone]) in the wound bed, then a small foam dressing. Change daily.   Increase  activity slowly   Complete by: As directed    Weight bearing as tolerated   Complete by: As directed      Allergies as of 12/27/2020   No Known Allergies     Medication List    STOP taking these medications   Levemir FlexTouch 100 UNIT/ML FlexPen Generic drug: insulin detemir Replaced by: insulin detemir 100 UNIT/ML injection   potassium chloride 10 MEQ CR capsule Commonly known as: MICRO-K     TAKE  these medications   (feeding supplement) PROSource Plus liquid Take 30 mLs by mouth 2 (two) times daily between meals.   feeding supplement (GLUCERNA SHAKE) Liqd Take 237 mLs by mouth 3 (three) times daily between meals.   aspirin 81 MG chewable tablet Chew 81 mg by mouth at bedtime.   docusate sodium 100 MG capsule Commonly known as: COLACE Take 1 capsule (100 mg total) by mouth 2 (two) times daily.   enoxaparin 40 MG/0.4ML injection Commonly known as: LOVENOX Inject 0.4 mLs (40 mg total) into the skin daily for 14 days.   HYDROcodone-acetaminophen 7.5-325 MG tablet Commonly known as: Norco Take 1-2 tablets by mouth 3 (three) times daily as needed for moderate pain.   insulin detemir 100 UNIT/ML injection Commonly known as: LEVEMIR Inject 0.07 mLs (7 Units total) into the skin 2 (two) times daily. Replaces: Levemir FlexTouch 100 UNIT/ML FlexPen   multivitamin with minerals Tabs tablet Take 1 tablet by mouth daily.   NovoLOG FlexPen 100 UNIT/ML FlexPen Generic drug: insulin aspart Inject 0-2 Units into the skin 3 (three) times daily between meals. Sliding scale   OneTouch Ultra test strip Generic drug: glucose blood 1 each 3 (three) times daily.   polyethylene glycol 17 g packet Commonly known as: MIRALAX / GLYCOLAX Take 17 g by mouth daily.            Discharge Care Instructions  (From admission, onward)         Start     Ordered   12/27/20 0000  Discharge wound care:       Comments: Cleanse left lateral foot wound with saline. Place a small piece of Aquacel [place] [phone]) in the wound bed, then a small foam dressing. Change daily.   12/27/20 1051   12/21/20 0000  Weight bearing as tolerated        12/21/20 1203          Contact information for follow-up providers    Tarry Kos, MD In 2 weeks.   Specialty: Orthopedic Surgery Why: For suture removal, For wound re-check Contact information: [address] [place]  [address] [phone]            Contact information for after-discharge care    Destination    [facility] .   Service: Skilled Nursing Contact information: [address] [place] [address] [phone]                 No Known Allergies  Consultations:  Orthopedic surgery   Procedures/Studies: DG Chest 1 View  Result Date: 12/20/2020 CLINICAL DATA:  Fall with right hip pain. EXAM: CHEST  1 VIEW COMPARISON:  None. FINDINGS: Cardiac silhouette is normal in size and configuration. Normal mediastinal and hilar contours. Lungs are hyperexpanded, but clear. No pleural effusion or pneumothorax. Skeletal structures are grossly intact. IMPRESSION: No acute cardiopulmonary disease. Electronically Signed   By: Amie Portland M.D.   On: 12/20/2020 11:37   DG Pelvis Portable  Result Date: 12/21/2020 CLINICAL DATA:  Fracture. EXAM: PORTABLE PELVIS 1-2 VIEWS COMPARISON:  X-ray C-arm 12/21/2020, x-ray hip 12/20/2020 FINDINGS: Status post open reduction and internal fixation of the right hip with total right hip arthroplasty. There is no evidence of new acute pelvic fracture or diastasis. No pelvic bone lesions are seen. Subcutaneus soft tissue edema and emphysema of the right hip consistent with postsurgical changes. IMPRESSION: Status post total right hip arthroplasty. No radiographic findings of surgical complication. Electronically Signed   By: Tish Frederickson M.D.   On: 12/21/2020 23:31   DG C-Arm 1-60 Min  Result Date: 12/21/2020 CLINICAL DATA:  Total hip arthroplasty. EXAM: OPERATIVE RIGHT HIP (WITH PELVIS IF PERFORMED) TECHNIQUE: Fluoroscopic spot image(s) were submitted for interpretation post-operatively. COMPARISON:  Preoperative radiograph 12/20/2020 FINDINGS: Two fluoroscopic spot views of the right hip in frontal projection demonstrate total hip arthroplasty in expected alignment. Total fluoroscopy in 20.1 seconds. Total dose 1.2373  mGy. IMPRESSION: Procedural fluoroscopy for right hip arthroplasty. Electronically Signed   By: Narda Rutherford M.D.   On: 12/21/2020 15:11   DG HIP OPERATIVE UNILAT W OR W/O PELVIS RIGHT  Result Date: 12/21/2020 CLINICAL DATA:  Total hip arthroplasty. EXAM: OPERATIVE RIGHT HIP (WITH PELVIS IF PERFORMED) TECHNIQUE: Fluoroscopic spot image(s) were submitted for interpretation post-operatively. COMPARISON:  Preoperative radiograph 12/20/2020 FINDINGS: Two fluoroscopic spot views of the right hip in frontal projection demonstrate total hip arthroplasty in expected alignment. Total fluoroscopy in 20.1 seconds. Total dose 1.2373 mGy. IMPRESSION: Procedural fluoroscopy for right hip arthroplasty. Electronically Signed   By: Narda Rutherford M.D.   On: 12/21/2020 15:11   DG HIP UNILAT W OR W/O PELVIS 2-3 VIEWS RIGHT  Result Date: 12/20/2020 CLINICAL DATA:  Pain following fall EXAM: DG HIP (WITH OR WITHOUT PELVIS) 2-3V RIGHT COMPARISON:  None. FINDINGS: Frontal pelvis as well as frontal and lateral right hip images were obtained. There is a subcapital femoral neck fracture on the right. There is superior and lateral displacement of the femoral neck and shaft with respect to the femoral head. No other fracture. No dislocation. Moderate symmetric narrowing noted each hip joint. IMPRESSION: Subcapital femoral neck fracture on the right with displacement of fracture fragments. No other fracture. No dislocation. Symmetric moderate narrowing of each hip joint noted. Electronically Signed   By: Bretta Bang III M.D.   On: 12/20/2020 11:37      RIGHT TOTAL HIP ARTHROPLASTY (12/21/2020)   Subjective: Some right leg pain but manageable. No issues overnight.  Discharge Exam: Vitals:   12/27/20 0345 12/27/20 0900  BP: (!) 125/49 (!) 137/50  Pulse: 80 80  Resp: 15 17  Temp: 98.3 F (36.8 C) 98.3 F (36.8 C)  SpO2: 99% 100%   Vitals:   12/26/20 0721 12/26/20 1922 12/27/20 0345 12/27/20 0900  BP: (!)  104/40 (!) 151/81 (!) 125/49 (!) 137/50  Pulse: 78 95 80 80  Resp: Temp: (!) 97.2 F (36.2 C) 98.5 F (36.9 C) 98.3 F (36.8 C) 98.3 F (36.8 C)  TempSrc: Oral Oral Oral Oral  SpO2: 99% 100% 99% 100%  Weight:      Height:        General: Pt is alert, awake, not in acute distress Cardiovascular: RRR, S1/S2 +, no rubs, no gallops Respiratory: CTA bilaterally, no wheezing, no rhonchi Abdominal: Soft, NT, ND, bowel sounds + Extremities: no edema, no cyanosis    The results of significant diagnostics from this hospitalization (including imaging, microbiology, ancillary and laboratory) are  listed below for reference.     Microbiology: Recent Results (from the past 240 hour(s))  Resp Panel by RT-PCR (Flu A&B, Covid) Nasopharyngeal Swab     Status: None   Collection Time: 12/20/20 10:39 AM   Specimen: Nasopharyngeal Swab; Nasopharyngeal(NP) swabs in vial transport medium  Result Value Ref Range Status   SARS Coronavirus 2 by RT PCR NEGATIVE NEGATIVE Final    Comment: (NOTE) SARS-CoV-2 target nucleic acids are NOT DETECTED.  The SARS-CoV-2 RNA is generally detectable in upper respiratory specimens during the acute phase of infection. The lowest concentration of SARS-CoV-2 viral copies this assay can detect is 138 copies/mL. A negative result does not preclude SARS-Cov-2 infection and should not be used as the sole basis for treatment or other patient management decisions. A negative result may occur with  improper specimen collection/handling, submission of specimen other than nasopharyngeal swab, presence of viral mutation(s) within the areas targeted by this assay, and inadequate number of viral copies(<138 copies/mL). A negative result must be combined with clinical observations, patient history, and epidemiological information. The expected result is Negative.  Fact Sheet for Patients:  BloggerCourse.comhttps://www.fda.gov/media/152166/download  Fact Sheet for Healthcare  Providers:  SeriousBroker.ithttps://www.fda.gov/media/152162/download  This test is no t yet approved or cleared by the [place] FDA and  has been authorized for detection and/or diagnosis of SARS-CoV-2 by FDA under an Emergency Use Authorization (EUA). This EUA will remain  in effect (meaning this test can be used) for the duration of the COVID-19 declaration under Section 564(b)(1) of the Act, 21 U.S.C.section 360bbb-3(b)(1), unless the authorization is terminated  or revoked sooner.       Influenza A by PCR NEGATIVE NEGATIVE Final   Influenza B by PCR NEGATIVE NEGATIVE Final    Comment: (NOTE) The Xpert Xpress SARS-CoV-2/FLU/RSV plus assay is intended as an aid in the diagnosis of influenza from Nasopharyngeal swab specimens and should not be used as a sole basis for treatment. Nasal washings and aspirates are unacceptable for Xpert Xpress SARS-CoV-2/FLU/RSV testing.  Fact Sheet for Patients: BloggerCourse.comhttps://www.fda.gov/media/152166/download  Fact Sheet for Healthcare Providers: SeriousBroker.ithttps://www.fda.gov/media/152162/download  This test is not yet approved or cleared by the [place] FDA and has been authorized for detection and/or diagnosis of SARS-CoV-2 by FDA under an Emergency Use Authorization (EUA). This EUA will remain in effect (meaning this test can be used) for the duration of the COVID-19 declaration under Section 564(b)(1) of the Act, 21 U.S.C. section 360bbb-3(b)(1), unless the authorization is terminated or revoked.  Performed at [facility], [address] [place], [place] [address]   Surgical pcr screen     Status: None   Collection Time: 12/20/20  5:46 PM   Specimen: Nasal Mucosa; Nasal Swab  Result Value Ref Range Status   MRSA, PCR NEGATIVE NEGATIVE Final   Staphylococcus aureus NEGATIVE NEGATIVE Final    Comment: (NOTE) The Xpert SA Assay (FDA approved for NASAL specimens in patients 85 years of age and older), is one component of a  comprehensive surveillance program. It is not intended to diagnose infection nor to guide or monitor treatment. Performed at [facility], [address] [place], [place] [address]      Labs: BNP (last 3 results) No results for input(s): BNP in the last 8760 hours. Basic Metabolic Panel: Recent Labs  Lab 12/23/20 0220 12/24/20 0223 12/25/20 0138 12/26/20 0231 12/27/20 0819  NA 136 135 135 138 136  K 3.6 4.1 3.9 4.5 4.2  CL 106 104 104 108  102  CO2 25 24 27 25 28   GLUCOSE 92 191* 178* 110* 253*  BUN 22 31* 34* 29* 27*  CREATININE 1.27* 1.32* 1.41* 1.42* 1.29*  CALCIUM 8.1* 8.0* 7.9* 7.8* 8.3*   Liver Function Tests: No results for input(s): AST, ALT, ALKPHOS, BILITOT, PROT, ALBUMIN in the last 168 hours. No results for input(s): LIPASE, AMYLASE in the last 168 hours. No results for input(s): AMMONIA in the last 168 hours. CBC: Recent Labs  Lab 12/22/20 0122 12/23/20 0220 12/24/20 0223 12/25/20 0138 12/26/20 0231  WBC 13.3* 22.4* 16.5* 12.9* 12.2*  HGB 10.1* 9.3* 8.8* 8.7* 8.4*  HCT 30.1* 27.5* 26.1* 25.8* 25.9*  MCV 91.5 90.5 88.8 92.8 95.2  PLT 162 160 156 196 236   Cardiac Enzymes: Recent Labs  Lab 12/25/20 0221  CKTOTAL 102   BNP: Invalid input(s): POCBNP CBG: Recent Labs  Lab 12/26/20 0628 12/26/20 1126 12/26/20 1629 12/26/20 1926 12/27/20 0636  GLUCAP 109* 200* 158* 187* 106*   D-Dimer No results for input(s): DDIMER in the last 72 hours. Hgb A1c No results for input(s): HGBA1C in the last 72 hours. Lipid Profile No results for input(s): CHOL, HDL, LDLCALC, TRIG, CHOLHDL, LDLDIRECT in the last 72 hours. Thyroid function studies No results for input(s): TSH, T4TOTAL, T3FREE, THYROIDAB in the last 72 hours.  Invalid input(s): FREET3 Anemia work up No results for input(s): VITAMINB12, FOLATE, FERRITIN, TIBC, IRON, RETICCTPCT in the last 72 hours. Urinalysis No results found for: COLORURINE, APPEARANCEUR, LABSPEC, PHURINE, GLUCOSEU,  HGBUR, BILIRUBINUR, KETONESUR, PROTEINUR, UROBILINOGEN, NITRITE, LEUKOCYTESUR Sepsis Labs Invalid input(s): PROCALCITONIN,  WBC,  LACTICIDVEN Microbiology Recent Results (from the past 240 hour(s))  Resp Panel by RT-PCR (Flu A&B, Covid) Nasopharyngeal Swab     Status: None   Collection Time: 12/20/20 10:39 AM   Specimen: Nasopharyngeal Swab; Nasopharyngeal(NP) swabs in vial transport medium  Result Value Ref Range Status   SARS Coronavirus 2 by RT PCR NEGATIVE NEGATIVE Final    Comment: (NOTE) SARS-CoV-2 target nucleic acids are NOT DETECTED.  The SARS-CoV-2 RNA is generally detectable in upper respiratory specimens during the acute phase of infection. The lowest concentration of SARS-CoV-2 viral copies this assay can detect is 138 copies/mL. A negative result does not preclude SARS-Cov-2 infection and should not be used as the sole basis for treatment or other patient management decisions. A negative result may occur with  improper specimen collection/handling, submission of specimen other than nasopharyngeal swab, presence of viral mutation(s) within the areas targeted by this assay, and inadequate number of viral copies(<138 copies/mL). A negative result must be combined with clinical observations, patient history, and epidemiological information. The expected result is Negative.  Fact Sheet for Patients:  02/19/21  Fact Sheet for Healthcare Providers:  BloggerCourse.com  This test is no t yet approved or cleared by the [place] FDA and  has been authorized for detection and/or diagnosis of SARS-CoV-2 by FDA under an Emergency Use Authorization (EUA). This EUA will remain  in effect (meaning this test can be used) for the duration of the COVID-19 declaration under Section 564(b)(1) of the Act, 21 U.S.C.section 360bbb-3(b)(1), unless the authorization is terminated  or revoked sooner.       Influenza A by PCR  NEGATIVE NEGATIVE Final   Influenza B by PCR NEGATIVE NEGATIVE Final    Comment: (NOTE) The Xpert Xpress SARS-CoV-2/FLU/RSV plus assay is intended as an aid in the diagnosis of influenza from Nasopharyngeal swab specimens and should not be used as a sole basis for  treatment. Nasal washings and aspirates are unacceptable for Xpert Xpress SARS-CoV-2/FLU/RSV testing.  Fact Sheet for Patients: BloggerCourse.com  Fact Sheet for Healthcare Providers: SeriousBroker.it  This test is not yet approved or cleared by the [place] FDA and has been authorized for detection and/or diagnosis of SARS-CoV-2 by FDA under an Emergency Use Authorization (EUA). This EUA will remain in effect (meaning this test can be used) for the duration of the COVID-19 declaration under Section 564(b)(1) of the Act, 21 U.S.C. section 360bbb-3(b)(1), unless the authorization is terminated or revoked.  Performed at [facility], [address] [place], [place] [address]   Surgical pcr screen     Status: None   Collection Time: 12/20/20  5:46 PM   Specimen: Nasal Mucosa; Nasal Swab  Result Value Ref Range Status   MRSA, PCR NEGATIVE NEGATIVE Final   Staphylococcus aureus NEGATIVE NEGATIVE Final    Comment: (NOTE) The Xpert SA Assay (FDA approved for NASAL specimens in patients 69 years of age and older), is one component of a comprehensive surveillance program. It is not intended to diagnose infection nor to guide or monitor treatment. Performed at [facility], [address] [place], [place] [address]      Time coordinating discharge: 35 minutes  SIGNED:   Jacquelin Hawking, MD [facility] 12/27/2020, 10:51 AM

## 2020-12-27 NOTE — Progress Notes (Signed)
Patient is being discharged to [facility] with [facility] providing transport to facility.  Patient's belongings including the AVS, DNR order, and prescriptions have been given to [facility] personnel to transport with the patient to the facility.  PIV removed prior to discharge with [facility] on the unit preparing the patient for transport at this time.

## 2020-12-31 DIAGNOSIS — D62 Acute posthemorrhagic anemia: Secondary | ICD-10-CM | POA: Diagnosis not present

## 2020-12-31 DIAGNOSIS — N1831 Chronic kidney disease, stage 3a: Secondary | ICD-10-CM | POA: Diagnosis not present

## 2020-12-31 DIAGNOSIS — E440 Moderate protein-calorie malnutrition: Secondary | ICD-10-CM | POA: Diagnosis not present

## 2020-12-31 DIAGNOSIS — E1121 Type 2 diabetes mellitus with diabetic nephropathy: Secondary | ICD-10-CM | POA: Diagnosis not present

## 2020-12-31 DIAGNOSIS — D72829 Elevated white blood cell count, unspecified: Secondary | ICD-10-CM | POA: Diagnosis not present

## 2020-12-31 DIAGNOSIS — K5900 Constipation, unspecified: Secondary | ICD-10-CM | POA: Diagnosis not present

## 2020-12-31 DIAGNOSIS — R296 Repeated falls: Secondary | ICD-10-CM | POA: Diagnosis not present

## 2021-01-01 ENCOUNTER — Ambulatory Visit: Payer: Medicare HMO | Admitting: Podiatry

## 2021-01-03 DIAGNOSIS — E1121 Type 2 diabetes mellitus with diabetic nephropathy: Secondary | ICD-10-CM | POA: Diagnosis not present

## 2021-01-03 DIAGNOSIS — D62 Acute posthemorrhagic anemia: Secondary | ICD-10-CM | POA: Diagnosis not present

## 2021-01-03 DIAGNOSIS — E440 Moderate protein-calorie malnutrition: Secondary | ICD-10-CM | POA: Diagnosis not present

## 2021-01-03 DIAGNOSIS — R296 Repeated falls: Secondary | ICD-10-CM | POA: Diagnosis not present

## 2021-01-03 DIAGNOSIS — N179 Acute kidney failure, unspecified: Secondary | ICD-10-CM | POA: Diagnosis not present

## 2021-01-03 DIAGNOSIS — K5900 Constipation, unspecified: Secondary | ICD-10-CM | POA: Diagnosis not present

## 2021-01-03 DIAGNOSIS — N1831 Chronic kidney disease, stage 3a: Secondary | ICD-10-CM | POA: Diagnosis not present

## 2021-01-16 DIAGNOSIS — N1831 Chronic kidney disease, stage 3a: Secondary | ICD-10-CM | POA: Diagnosis not present

## 2021-01-16 DIAGNOSIS — S91302A Unspecified open wound, left foot, initial encounter: Secondary | ICD-10-CM | POA: Diagnosis not present

## 2021-01-16 DIAGNOSIS — N179 Acute kidney failure, unspecified: Secondary | ICD-10-CM | POA: Diagnosis not present

## 2021-01-16 DIAGNOSIS — K5900 Constipation, unspecified: Secondary | ICD-10-CM | POA: Diagnosis not present

## 2021-01-16 DIAGNOSIS — E440 Moderate protein-calorie malnutrition: Secondary | ICD-10-CM | POA: Diagnosis not present

## 2021-01-16 DIAGNOSIS — R296 Repeated falls: Secondary | ICD-10-CM | POA: Diagnosis not present

## 2021-01-16 DIAGNOSIS — E1121 Type 2 diabetes mellitus with diabetic nephropathy: Secondary | ICD-10-CM | POA: Diagnosis not present

## 2021-01-16 DIAGNOSIS — D62 Acute posthemorrhagic anemia: Secondary | ICD-10-CM | POA: Diagnosis not present

## 2021-01-24 ENCOUNTER — Encounter: Payer: Self-pay | Admitting: Orthopaedic Surgery

## 2021-01-24 ENCOUNTER — Ambulatory Visit (INDEPENDENT_AMBULATORY_CARE_PROVIDER_SITE_OTHER): Payer: Medicare HMO | Admitting: Orthopaedic Surgery

## 2021-01-24 ENCOUNTER — Ambulatory Visit (INDEPENDENT_AMBULATORY_CARE_PROVIDER_SITE_OTHER): Payer: Medicare HMO

## 2021-01-24 DIAGNOSIS — S72011A Unspecified intracapsular fracture of right femur, initial encounter for closed fracture: Secondary | ICD-10-CM

## 2021-01-24 DIAGNOSIS — Z96641 Presence of right artificial hip joint: Secondary | ICD-10-CM | POA: Insufficient documentation

## 2021-01-24 DIAGNOSIS — M25551 Pain in right hip: Secondary | ICD-10-CM | POA: Diagnosis not present

## 2021-01-24 NOTE — Progress Notes (Signed)
   Post-Op Visit Note   Patient: Patricia Green           Date of Birth: May 12, 1930           MRN: [id number] Visit Date: 01/24/2021 PCP: Soundra Pilon, FNP   Assessment & Plan:  Chief Complaint:  Chief Complaint  Patient presents with  . Right Hip - Routine Post Op   Visit Diagnoses:  1. Status post total replacement of right hip   2. Closed subcapital fracture of femur, right, initial encounter [facility])     Plan:   Patricia Green is an 87 year old female who is approximately 5 weeks status post a right total hip replacement for femoral neck fracture.  She is brought in today by her brother.  She is living at home and ambulating with a cane.  She does home exercises and some walking around the house.  Reports no pain in her hip.  The sutures were removed at the nursing facility.  Right hip shows a well-healed surgical scar.  No signs of infection or swelling or drainage.  Painless range of motion of the hip.  Unremarkable x-rays.  Patricia Green is recovering well from the surgery.  They have not heard anything about home health PT.  I will put in an order for this.  Implant card provided today.  She will continue to do her home exercises for strengthening.  I will see her back in another 6 weeks for recheck.  They will need standing AP pelvis x-rays on return.  Follow-Up Instructions: Return in about 4 weeks (around 02/21/2021).   Orders:  Orders Placed This Encounter  Procedures  . XR HIP UNILAT W OR W/O PELVIS 2-3 VIEWS RIGHT   No orders of the defined types were placed in this encounter.   Imaging: XR HIP UNILAT W OR W/O PELVIS 2-3 VIEWS RIGHT  Result Date: 01/24/2021 Stable right total hip replacement without complication.   PMFS History: Patient Active Problem List   Diagnosis Date Noted  . Status post total replacement of right hip 01/24/2021  . AKI (acute kidney injury) (HCC) 12/27/2020  . CKD (chronic kidney disease), stage III (HCC) 12/27/2020  . Malnutrition of moderate degree  12/21/2020  . Closed subcapital fracture of femur, right, initial encounter (HCC) 12/20/2020  . Type 2 diabetes mellitus with foot ulcer (HCC) 12/20/2020  . Hypoglycemia due to insulin 12/20/2020   Past Medical History:  Diagnosis Date  . Diabetes mellitus without complication (HCC)     History reviewed. No pertinent family history.  Past Surgical History:  Procedure Laterality Date  . Bilateral Cataract extraction    . CHOLECYSTECTOMY    . EYE SURGERY    . lumpectomy from both Breasts    . TOTAL HIP ARTHROPLASTY Right 12/21/2020   Procedure: TOTAL HIP ARTHROPLASTY ANTERIOR APPROACH;  Surgeon: Tarry Kos, MD;  Location: [facility] OR;  Service: Orthopedics;  Laterality: Right;   Social History   Occupational History  . Not on file  Tobacco Use  . Smoking status: Former Smoker    Types: Cigarettes  . Smokeless tobacco: Never Used  Vaping Use  . Vaping Use: Never used  Substance and Sexual Activity  . Alcohol use: Never  . Drug use: Never  . Sexual activity: Not Currently

## 2021-01-25 ENCOUNTER — Telehealth: Payer: Self-pay

## 2021-01-25 NOTE — Telephone Encounter (Signed)
Emailed Dorene Sorrow HHPT order.

## 2021-02-04 ENCOUNTER — Other Ambulatory Visit: Payer: Self-pay

## 2021-02-04 ENCOUNTER — Ambulatory Visit: Payer: Medicare HMO | Admitting: Podiatry

## 2021-02-04 ENCOUNTER — Encounter: Payer: Self-pay | Admitting: Podiatry

## 2021-02-04 DIAGNOSIS — B351 Tinea unguium: Secondary | ICD-10-CM

## 2021-02-04 DIAGNOSIS — M79674 Pain in right toe(s): Secondary | ICD-10-CM | POA: Diagnosis not present

## 2021-02-04 DIAGNOSIS — L97509 Non-pressure chronic ulcer of other part of unspecified foot with unspecified severity: Secondary | ICD-10-CM | POA: Diagnosis not present

## 2021-02-04 DIAGNOSIS — M79675 Pain in left toe(s): Secondary | ICD-10-CM | POA: Diagnosis not present

## 2021-02-04 DIAGNOSIS — E11621 Type 2 diabetes mellitus with foot ulcer: Secondary | ICD-10-CM | POA: Diagnosis not present

## 2021-02-04 DIAGNOSIS — L84 Corns and callosities: Secondary | ICD-10-CM | POA: Diagnosis not present

## 2021-02-04 DIAGNOSIS — Z794 Long term (current) use of insulin: Secondary | ICD-10-CM | POA: Diagnosis not present

## 2021-02-04 NOTE — Progress Notes (Signed)
  Subjective:  Patient ID: Patricia Green, female    DOB: 1930/01/24,  MRN: [id number]  Chief Complaint  Patient presents with  . Diabetic Ulcer      2wk f/u diab ulcer     87 y.o. female returns with the above complaint. History confirmed with patient.  Here again with her son..  She is doing fairly well she had a hip fracture and replacement in April and so was in the hospital and rehab.  The wound is healed.  They have been trying to get the diabetic shoes but have had difficulty between our office and her primary's office and getting them  Objective:  Physical Exam: warm, good capillary refill and normal DP and PT pulses.  Hammertoe deformities on left foot.  Ulcer has healed is now a preulcerative callus at submet 1 on the right and submet 5 on the left         Assessment:   1. Pain due to onychomycosis of toenails of both feet   2. Pre-ulcerative calluses   3. Type 2 diabetes mellitus with foot ulcer, with long-term current use of insulin (HCC)      Plan:  Patient was evaluated and treated and all questions answered.  Patient educated on diabetes. Discussed proper diabetic foot care and discussed risks and complications of disease. Educated patient in depth on reasons to return to the office immediately should he/she discover anything concerning or new on the feet. All questions answered. Discussed proper shoes as well.   They discussed the diabetic shoe issue with the orthotics department I also discussed with them today.  The issue is that she sees a Publishing rights manager for her PCP and that MD or DO needs to sign it.  It was being sent to an MD at the practice that they were not familiar with and so the paperwork has not been returned.  We gave him a copy of the paperwork and he will take it directly to [facility] physicians today.  Discussed the etiology and treatment options for the condition in detail with the patient. Educated patient on the topical and oral treatment options  for mycotic nails. Recommended debridement of the nails today. Sharp and mechanical debridement performed of all painful and mycotic nails today. Nails debrided in length and thickness using a nail nipper to level of comfort. Discussed treatment options including appropriate shoe gear. Follow up as needed for painful nails.  All symptomatic hyperkeratoses were safely debrided with a sterile #15 blade to patient's level of comfort without incident. We discussed preventative and palliative care of these lesions including supportive and accommodative shoegear, padding, prefabricated and custom molded accommodative orthoses, use of a pumice stone and lotions/creams daily.     Return in about 3 months (around 05/07/2021) for at risk diabetic foot care.

## 2021-02-06 DIAGNOSIS — S72011D Unspecified intracapsular fracture of right femur, subsequent encounter for closed fracture with routine healing: Secondary | ICD-10-CM | POA: Diagnosis not present

## 2021-02-06 DIAGNOSIS — Z9181 History of falling: Secondary | ICD-10-CM | POA: Diagnosis not present

## 2021-02-06 DIAGNOSIS — Z4789 Encounter for other orthopedic aftercare: Secondary | ICD-10-CM | POA: Diagnosis not present

## 2021-02-06 DIAGNOSIS — E119 Type 2 diabetes mellitus without complications: Secondary | ICD-10-CM | POA: Diagnosis not present

## 2021-02-06 DIAGNOSIS — Z794 Long term (current) use of insulin: Secondary | ICD-10-CM | POA: Diagnosis not present

## 2021-02-07 ENCOUNTER — Telehealth: Payer: Self-pay | Admitting: Orthopaedic Surgery

## 2021-02-07 NOTE — Telephone Encounter (Signed)
Received a vm from Theresa Duty w/ [facility], asking for verbal order to begin [facility] Also requesting order for Rolling Walker for patient. Fax [phone]. callback [phone]

## 2021-02-08 NOTE — Telephone Encounter (Signed)
LMOM.  Okay on orders. Also faxed Rx for rolling walker to fax number below.

## 2021-02-12 ENCOUNTER — Telehealth: Payer: Self-pay

## 2021-02-12 DIAGNOSIS — Z9181 History of falling: Secondary | ICD-10-CM | POA: Diagnosis not present

## 2021-02-12 DIAGNOSIS — E119 Type 2 diabetes mellitus without complications: Secondary | ICD-10-CM | POA: Diagnosis not present

## 2021-02-12 DIAGNOSIS — S72011D Unspecified intracapsular fracture of right femur, subsequent encounter for closed fracture with routine healing: Secondary | ICD-10-CM | POA: Diagnosis not present

## 2021-02-12 DIAGNOSIS — Z794 Long term (current) use of insulin: Secondary | ICD-10-CM | POA: Diagnosis not present

## 2021-02-12 NOTE — Telephone Encounter (Signed)
Stephanie from [facility] called she stated she received a order for a Rolator she stated she is missing the patients height, weight and diagnosis code she is requesting the additional information to be sent .  Fax:[phone] Call [phone] extension:[phone]

## 2021-02-13 NOTE — Telephone Encounter (Signed)
New rx faxed with 5'2"  116lbs and Z96.641 diagnosis code.

## 2021-02-13 NOTE — Telephone Encounter (Signed)
I spoke with Patricia Green. Patient is 5'2" and 116lbs.

## 2021-02-13 NOTE — Telephone Encounter (Signed)
I called number listed in chart (daughter's cell phone) to see if I can find out patient's height. Received voicemail. Did not leave message and will try again.

## 2021-02-14 DIAGNOSIS — S72011D Unspecified intracapsular fracture of right femur, subsequent encounter for closed fracture with routine healing: Secondary | ICD-10-CM | POA: Diagnosis not present

## 2021-02-14 DIAGNOSIS — Z9181 History of falling: Secondary | ICD-10-CM | POA: Diagnosis not present

## 2021-02-14 DIAGNOSIS — Z794 Long term (current) use of insulin: Secondary | ICD-10-CM | POA: Diagnosis not present

## 2021-02-14 DIAGNOSIS — E119 Type 2 diabetes mellitus without complications: Secondary | ICD-10-CM | POA: Diagnosis not present

## 2021-02-14 DIAGNOSIS — Z96641 Presence of right artificial hip joint: Secondary | ICD-10-CM | POA: Diagnosis not present

## 2021-02-15 DIAGNOSIS — Z794 Long term (current) use of insulin: Secondary | ICD-10-CM | POA: Diagnosis not present

## 2021-02-15 DIAGNOSIS — E119 Type 2 diabetes mellitus without complications: Secondary | ICD-10-CM | POA: Diagnosis not present

## 2021-02-15 DIAGNOSIS — S72011D Unspecified intracapsular fracture of right femur, subsequent encounter for closed fracture with routine healing: Secondary | ICD-10-CM | POA: Diagnosis not present

## 2021-02-15 DIAGNOSIS — Z9181 History of falling: Secondary | ICD-10-CM | POA: Diagnosis not present

## 2021-02-19 ENCOUNTER — Telehealth: Payer: Self-pay | Admitting: Orthopaedic Surgery

## 2021-02-19 DIAGNOSIS — S72011D Unspecified intracapsular fracture of right femur, subsequent encounter for closed fracture with routine healing: Secondary | ICD-10-CM | POA: Diagnosis not present

## 2021-02-19 DIAGNOSIS — E119 Type 2 diabetes mellitus without complications: Secondary | ICD-10-CM | POA: Diagnosis not present

## 2021-02-19 DIAGNOSIS — Z794 Long term (current) use of insulin: Secondary | ICD-10-CM | POA: Diagnosis not present

## 2021-02-19 DIAGNOSIS — Z9181 History of falling: Secondary | ICD-10-CM | POA: Diagnosis not present

## 2021-02-19 NOTE — Telephone Encounter (Signed)
Pt and home physical therapy called asking for an extension for services to continue for 3 more weeks (2x per week). Pt has an upcoming appt on 02/21/21 and the call back number if needed is [phone].

## 2021-02-20 NOTE — Telephone Encounter (Signed)
Called no answer LMOM. Just wanted to approve orders.

## 2021-02-21 ENCOUNTER — Ambulatory Visit: Payer: Self-pay

## 2021-02-21 ENCOUNTER — Ambulatory Visit (INDEPENDENT_AMBULATORY_CARE_PROVIDER_SITE_OTHER): Payer: Medicare HMO | Admitting: Orthopaedic Surgery

## 2021-02-21 ENCOUNTER — Encounter: Payer: Self-pay | Admitting: Orthopaedic Surgery

## 2021-02-21 DIAGNOSIS — Z96641 Presence of right artificial hip joint: Secondary | ICD-10-CM | POA: Diagnosis not present

## 2021-02-21 NOTE — Progress Notes (Signed)
   Post-Op Visit Note   Patient: Patricia Green           Date of Birth: [birth year]           MRN: [id number] Visit Date: 02/21/2021 PCP: Soundra Pilon, FNP   Assessment & Plan:  Chief Complaint:  Chief Complaint  Patient presents with   Right Hip - Pain   Visit Diagnoses:  1. Status post total replacement of right hip     Plan:   Patricia Green is almost 9 weeks status post right total hip replacement for femoral neck fracture.  She is ambulating with a cane at this point.  Her brother is with her today.  She reports some occasional soreness and discomfort in the buttock with increased activity.  She continues to do physical therapy at home.  Her right hip surgical scars fully healed.  No signs of infection.  Painless range of motion of the hip.  X-rays unremarkable.  Ambulating at a normal pace and gait with a cane.  At this point Patricia Green has recovered well from the surgery.  She will continue to make efforts at strengthening her hip.  Dental prophylaxis reinforced.  We will see her back as needed.  Follow-Up Instructions: Return if symptoms worsen or fail to improve.   Orders:  Orders Placed This Encounter  Procedures   XR Pelvis 1-2 Views   No orders of the defined types were placed in this encounter.   Imaging: XR Pelvis 1-2 Views  Result Date: 02/21/2021 Stable total hip replacement without complications   PMFS History: Patient Active Problem List   Diagnosis Date Noted   Status post total replacement of right hip 01/24/2021   AKI (acute kidney injury) (HCC) 12/27/2020   CKD (chronic kidney disease), stage III (HCC) 12/27/2020   Malnutrition of moderate degree 12/21/2020   Closed subcapital fracture of femur, right, initial encounter (HCC) 12/20/2020   Type 2 diabetes mellitus with foot ulcer (HCC) 12/20/2020   Hypoglycemia due to insulin 12/20/2020   Past Medical History:  Diagnosis Date   Diabetes mellitus without complication (HCC)     History reviewed. No  pertinent family history.  Past Surgical History:  Procedure Laterality Date   Bilateral Cataract extraction     CHOLECYSTECTOMY     EYE SURGERY     lumpectomy from both Breasts     TOTAL HIP ARTHROPLASTY Right 12/21/2020   Procedure: TOTAL HIP ARTHROPLASTY ANTERIOR APPROACH;  Surgeon: Tarry Kos, MD;  Location: [facility] OR;  Service: Orthopedics;  Laterality: Right;   Social History   Occupational History   Not on file  Tobacco Use   Smoking status: Former    Pack years: 0.00    Types: Cigarettes   Smokeless tobacco: Never  Vaping Use   Vaping Use: Never used  Substance and Sexual Activity   Alcohol use: Never   Drug use: Never   Sexual activity: Not Currently

## 2021-02-22 DIAGNOSIS — E119 Type 2 diabetes mellitus without complications: Secondary | ICD-10-CM | POA: Diagnosis not present

## 2021-02-22 DIAGNOSIS — Z794 Long term (current) use of insulin: Secondary | ICD-10-CM | POA: Diagnosis not present

## 2021-02-22 DIAGNOSIS — S72011D Unspecified intracapsular fracture of right femur, subsequent encounter for closed fracture with routine healing: Secondary | ICD-10-CM | POA: Diagnosis not present

## 2021-02-22 DIAGNOSIS — Z9181 History of falling: Secondary | ICD-10-CM | POA: Diagnosis not present

## 2021-02-25 DIAGNOSIS — Z794 Long term (current) use of insulin: Secondary | ICD-10-CM | POA: Diagnosis not present

## 2021-02-25 DIAGNOSIS — S72011D Unspecified intracapsular fracture of right femur, subsequent encounter for closed fracture with routine healing: Secondary | ICD-10-CM | POA: Diagnosis not present

## 2021-02-25 DIAGNOSIS — Z9181 History of falling: Secondary | ICD-10-CM | POA: Diagnosis not present

## 2021-02-25 DIAGNOSIS — E119 Type 2 diabetes mellitus without complications: Secondary | ICD-10-CM | POA: Diagnosis not present

## 2021-02-27 DIAGNOSIS — S72011D Unspecified intracapsular fracture of right femur, subsequent encounter for closed fracture with routine healing: Secondary | ICD-10-CM | POA: Diagnosis not present

## 2021-02-27 DIAGNOSIS — Z794 Long term (current) use of insulin: Secondary | ICD-10-CM | POA: Diagnosis not present

## 2021-02-27 DIAGNOSIS — E119 Type 2 diabetes mellitus without complications: Secondary | ICD-10-CM | POA: Diagnosis not present

## 2021-02-27 DIAGNOSIS — Z9181 History of falling: Secondary | ICD-10-CM | POA: Diagnosis not present

## 2021-03-04 DIAGNOSIS — Z794 Long term (current) use of insulin: Secondary | ICD-10-CM | POA: Diagnosis not present

## 2021-03-04 DIAGNOSIS — Z9181 History of falling: Secondary | ICD-10-CM | POA: Diagnosis not present

## 2021-03-04 DIAGNOSIS — E119 Type 2 diabetes mellitus without complications: Secondary | ICD-10-CM | POA: Diagnosis not present

## 2021-03-04 DIAGNOSIS — S72011D Unspecified intracapsular fracture of right femur, subsequent encounter for closed fracture with routine healing: Secondary | ICD-10-CM | POA: Diagnosis not present

## 2021-03-06 DIAGNOSIS — E119 Type 2 diabetes mellitus without complications: Secondary | ICD-10-CM | POA: Diagnosis not present

## 2021-03-06 DIAGNOSIS — S72011D Unspecified intracapsular fracture of right femur, subsequent encounter for closed fracture with routine healing: Secondary | ICD-10-CM | POA: Diagnosis not present

## 2021-03-06 DIAGNOSIS — Z794 Long term (current) use of insulin: Secondary | ICD-10-CM | POA: Diagnosis not present

## 2021-03-06 DIAGNOSIS — Z9181 History of falling: Secondary | ICD-10-CM | POA: Diagnosis not present

## 2021-03-11 DIAGNOSIS — S72011D Unspecified intracapsular fracture of right femur, subsequent encounter for closed fracture with routine healing: Secondary | ICD-10-CM | POA: Diagnosis not present

## 2021-03-11 DIAGNOSIS — Z794 Long term (current) use of insulin: Secondary | ICD-10-CM | POA: Diagnosis not present

## 2021-03-11 DIAGNOSIS — Z9181 History of falling: Secondary | ICD-10-CM | POA: Diagnosis not present

## 2021-03-11 DIAGNOSIS — E119 Type 2 diabetes mellitus without complications: Secondary | ICD-10-CM | POA: Diagnosis not present

## 2021-03-13 DIAGNOSIS — Z794 Long term (current) use of insulin: Secondary | ICD-10-CM | POA: Diagnosis not present

## 2021-03-13 DIAGNOSIS — S72011D Unspecified intracapsular fracture of right femur, subsequent encounter for closed fracture with routine healing: Secondary | ICD-10-CM | POA: Diagnosis not present

## 2021-03-13 DIAGNOSIS — Z9181 History of falling: Secondary | ICD-10-CM | POA: Diagnosis not present

## 2021-03-13 DIAGNOSIS — E119 Type 2 diabetes mellitus without complications: Secondary | ICD-10-CM | POA: Diagnosis not present

## 2021-03-20 DIAGNOSIS — S72011D Unspecified intracapsular fracture of right femur, subsequent encounter for closed fracture with routine healing: Secondary | ICD-10-CM | POA: Diagnosis not present

## 2021-03-20 DIAGNOSIS — E119 Type 2 diabetes mellitus without complications: Secondary | ICD-10-CM | POA: Diagnosis not present

## 2021-03-20 DIAGNOSIS — Z794 Long term (current) use of insulin: Secondary | ICD-10-CM | POA: Diagnosis not present

## 2021-03-20 DIAGNOSIS — Z9181 History of falling: Secondary | ICD-10-CM | POA: Diagnosis not present

## 2021-03-22 ENCOUNTER — Telehealth: Payer: Self-pay | Admitting: Orthopaedic Surgery

## 2021-03-22 NOTE — Telephone Encounter (Signed)
Called Flore approved orders.

## 2021-03-22 NOTE — Telephone Encounter (Signed)
Received call from Flore (PT) with [facility] needing verbal orders for HHPT 2 Wk 2. The number to contact Flore is [phone]

## 2021-03-26 DIAGNOSIS — Z794 Long term (current) use of insulin: Secondary | ICD-10-CM | POA: Diagnosis not present

## 2021-03-26 DIAGNOSIS — S72011D Unspecified intracapsular fracture of right femur, subsequent encounter for closed fracture with routine healing: Secondary | ICD-10-CM | POA: Diagnosis not present

## 2021-03-26 DIAGNOSIS — E119 Type 2 diabetes mellitus without complications: Secondary | ICD-10-CM | POA: Diagnosis not present

## 2021-03-26 DIAGNOSIS — Z9181 History of falling: Secondary | ICD-10-CM | POA: Diagnosis not present

## 2021-03-28 DIAGNOSIS — S72011D Unspecified intracapsular fracture of right femur, subsequent encounter for closed fracture with routine healing: Secondary | ICD-10-CM | POA: Diagnosis not present

## 2021-03-28 DIAGNOSIS — Z794 Long term (current) use of insulin: Secondary | ICD-10-CM | POA: Diagnosis not present

## 2021-03-28 DIAGNOSIS — E119 Type 2 diabetes mellitus without complications: Secondary | ICD-10-CM | POA: Diagnosis not present

## 2021-03-28 DIAGNOSIS — Z9181 History of falling: Secondary | ICD-10-CM | POA: Diagnosis not present

## 2021-04-01 DIAGNOSIS — Z794 Long term (current) use of insulin: Secondary | ICD-10-CM | POA: Diagnosis not present

## 2021-04-01 DIAGNOSIS — E119 Type 2 diabetes mellitus without complications: Secondary | ICD-10-CM | POA: Diagnosis not present

## 2021-04-01 DIAGNOSIS — Z9181 History of falling: Secondary | ICD-10-CM | POA: Diagnosis not present

## 2021-04-01 DIAGNOSIS — S72011D Unspecified intracapsular fracture of right femur, subsequent encounter for closed fracture with routine healing: Secondary | ICD-10-CM | POA: Diagnosis not present

## 2021-04-04 DIAGNOSIS — Z9181 History of falling: Secondary | ICD-10-CM | POA: Diagnosis not present

## 2021-04-04 DIAGNOSIS — Z794 Long term (current) use of insulin: Secondary | ICD-10-CM | POA: Diagnosis not present

## 2021-04-04 DIAGNOSIS — E119 Type 2 diabetes mellitus without complications: Secondary | ICD-10-CM | POA: Diagnosis not present

## 2021-04-04 DIAGNOSIS — S72011D Unspecified intracapsular fracture of right femur, subsequent encounter for closed fracture with routine healing: Secondary | ICD-10-CM | POA: Diagnosis not present

## 2021-05-07 ENCOUNTER — Ambulatory Visit: Payer: Medicare HMO | Admitting: Podiatry

## 2021-05-10 DIAGNOSIS — E1169 Type 2 diabetes mellitus with other specified complication: Secondary | ICD-10-CM | POA: Diagnosis not present

## 2021-05-10 DIAGNOSIS — U071 COVID-19: Secondary | ICD-10-CM | POA: Diagnosis not present

## 2021-05-10 DIAGNOSIS — Z794 Long term (current) use of insulin: Secondary | ICD-10-CM | POA: Diagnosis not present

## 2021-06-03 ENCOUNTER — Ambulatory Visit (INDEPENDENT_AMBULATORY_CARE_PROVIDER_SITE_OTHER): Payer: Medicare HMO

## 2021-06-03 ENCOUNTER — Other Ambulatory Visit: Payer: Self-pay

## 2021-06-03 ENCOUNTER — Ambulatory Visit: Payer: Medicare HMO | Admitting: Podiatry

## 2021-06-03 DIAGNOSIS — L97522 Non-pressure chronic ulcer of other part of left foot with fat layer exposed: Secondary | ICD-10-CM | POA: Diagnosis not present

## 2021-06-03 DIAGNOSIS — M79675 Pain in left toe(s): Secondary | ICD-10-CM | POA: Diagnosis not present

## 2021-06-03 DIAGNOSIS — E08621 Diabetes mellitus due to underlying condition with foot ulcer: Secondary | ICD-10-CM | POA: Diagnosis not present

## 2021-06-03 DIAGNOSIS — M79674 Pain in right toe(s): Secondary | ICD-10-CM

## 2021-06-03 DIAGNOSIS — B351 Tinea unguium: Secondary | ICD-10-CM

## 2021-06-03 MED ORDER — GENTAMICIN SULFATE 0.1 % EX CREA: 1.0000 "application " | TOPICAL_CREAM | Freq: Two times a day (BID) | CUTANEOUS | 1 refills | Status: AC

## 2021-06-03 MED ORDER — DOXYCYCLINE HYCLATE 100 MG PO TABS: 100.0000 mg | ORAL_TABLET | Freq: Two times a day (BID) | ORAL | 0 refills | Status: AC

## 2021-06-03 NOTE — Progress Notes (Signed)
   Subjective:  87 y.o. female with PMHx of diabetes mellitus presenting today for evaluation of a new ulcer that is developed to the patient's left foot.  She was last seen in the office on 02/04/2021.  Patient states that she has been having some left foot irritation with redness.  She also presents today to have her nails trimmed.  She states that her nails are getting long and thickened discolored and symptomatic with shoes.  She presents for further treatment and evaluation   Past Medical History:  Diagnosis Date   Diabetes mellitus without complication (HCC)       Objective/Physical Exam General: The patient is alert and oriented x3 in no acute distress.  Dermatology:  Wound #1 noted to the plantar aspect of the fifth MTP joint left measuring approximately 1.5 x 1.5 x 0.2 cm (LxWxD).   To the noted ulceration(s), there is no eschar. There is a moderate amount of slough, fibrin, and necrotic tissue noted. Granulation tissue and wound base is red. There is a minimal amount of serosanguineous drainage noted. There is no exposed bone muscle-tendon ligament or joint. There is no malodor. Periwound integrity is intact. Skin is warm, dry and supple bilateral lower extremities.  Hyperkeratotic elongated dystrophic nails noted 1-5 bilateral consistent with onychomycosis of the toenails  Vascular: Palpable pedal pulses bilaterally. No edema or erythema noted. Capillary refill within normal limits.  Neurological: Epicritic and protective threshold diminished bilaterally.   Musculoskeletal Exam: Hammertoe deformities 2-5 bilateral contributing to increased plantar pressure to the metatarsal heads  Radiographic exam: No osteolytic process noted.  No cortical destruction or concern for osteomyelitis at the moment  Assessment: 1.  Ulcer plantar aspect fifth MTPJ left secondary to diabetes mellitus 2. diabetes mellitus w/ peripheral neuropathy 3.  Pain due to onychomycosis of toenails  bilateral   Plan of Care:  1. Patient was evaluated. 2. medically necessary excisional debridement including subcutaneous tissue was performed using a tissue nipper and a chisel blade. Excisional debridement of all the necrotic nonviable tissue down to healthy bleeding viable tissue was performed with post-debridement measurements same as pre-. 3. the wound was cleansed and dry sterile dressing applied. 4.  Cultures taken and sent to pathology for culture and sensitivity  5.  Prescription for doxycycline 100 mg 2 times daily #20  6.  Prescription for gentamicin cream applied daily to the wound  7.  Mechanical debridement of nails 1-5 bilateral was performed using nail nipper without incident or bleeding  8.  Patient has a cam boot at home.  Wear daily  9.  Patient is to return to clinic in 2 weeks for follow-up evaluation of the ulcer.   Felecia Shelling, DPM [facility]  Dr. Felecia Shelling, DPM    [address] [place], [place] [address]                Office [phone]  Fax [phone]

## 2021-06-07 LAB — WOUND CULTURE
MICRO NUMBER:: 12397894
SPECIMEN QUALITY:: ADEQUATE

## 2021-06-12 ENCOUNTER — Other Ambulatory Visit: Payer: Self-pay

## 2021-06-12 ENCOUNTER — Ambulatory Visit (INDEPENDENT_AMBULATORY_CARE_PROVIDER_SITE_OTHER): Payer: Medicare HMO | Admitting: Podiatry

## 2021-06-12 DIAGNOSIS — E11621 Type 2 diabetes mellitus with foot ulcer: Secondary | ICD-10-CM

## 2021-06-12 DIAGNOSIS — L97421 Non-pressure chronic ulcer of left heel and midfoot limited to breakdown of skin: Secondary | ICD-10-CM | POA: Diagnosis not present

## 2021-06-12 NOTE — Patient Instructions (Signed)
Continue wearing the surgical shoe until next visit  Use the cream on the bottom  Complete all the antibiotics

## 2021-06-12 NOTE — Progress Notes (Signed)
  Subjective:  Patient ID: Patricia Green, female    DOB: [birth year],  MRN: [id number]  Chief Complaint  Patient presents with   Ulcer    Left foot ulcer follow up.     87 y.o. female presents with the above complaint. History confirmed with patient.  Developed new ulceration of the lateral side of the fifth toe they saw Dr. Logan Bores about a week and a half ago they been using gentamicin cream and wearing the offloading shoe  Objective:  Physical Exam: warm, good capillary refill, no trophic changes or ulcerative lesions, normal DP and PT pulses, and normal sensory exam. Left Foot: Partial thickness ulceration with minor skin breakdown on the plantar portion of the fifth MTPJ, the lateral side has healed completely Assessment:   1. Diabetic ulcer of left midfoot associated with type 2 diabetes mellitus, limited to breakdown of skin (HCC)      Plan:  Patient was evaluated and treated and all questions answered.  Continue using the gentamicin cream Continue offloading surgical shoe for at least 1 more week and then can resume regular shoe gear Should be healed by next visit  Return in about 3 weeks (around 07/03/2021) for wound care.

## 2021-06-26 ENCOUNTER — Telehealth: Payer: Self-pay | Admitting: Podiatry

## 2021-06-26 NOTE — Telephone Encounter (Signed)
Pts daughter in law called and left message asking if we received the signed documents that were refaxed to us(we did not get the previous ones).She was wanting to make sure before the next appt.   I returned call and left message but then investigated more and called her back and told her we did get them this time but they are expired because of the last office visit was over 6 months ago(medicare policy) Pt has appt Monday with the pcp and they have a copy of the documents. I asked pts daughter in law to take to the appt and bring back signed and she could bring them to the appt with Dr Lilian Kapur. I did explained everything is ordered we just need the updated documents.

## 2021-07-01 ENCOUNTER — Telehealth: Payer: Self-pay | Admitting: Podiatry

## 2021-07-01 DIAGNOSIS — E876 Hypokalemia: Secondary | ICD-10-CM | POA: Diagnosis not present

## 2021-07-01 DIAGNOSIS — Z681 Body mass index (BMI) 19 or less, adult: Secondary | ICD-10-CM | POA: Diagnosis not present

## 2021-07-01 DIAGNOSIS — N1832 Chronic kidney disease, stage 3b: Secondary | ICD-10-CM | POA: Diagnosis not present

## 2021-07-01 DIAGNOSIS — E1169 Type 2 diabetes mellitus with other specified complication: Secondary | ICD-10-CM | POA: Diagnosis not present

## 2021-07-01 DIAGNOSIS — I10 Essential (primary) hypertension: Secondary | ICD-10-CM | POA: Diagnosis not present

## 2021-07-01 NOTE — Telephone Encounter (Signed)
Pts daughter left message stating they went to the pcp today and they signed the diabetic shoe documents and pt is to be brining it to our office at her appt tomorrow with Dr Lilian Kapur. The daughter put it in an envelope with attention to [facility].  I called pts daughter and told her I put it in the note to make sure I get the envelope from pt addressed to me.

## 2021-07-02 ENCOUNTER — Other Ambulatory Visit: Payer: Self-pay

## 2021-07-02 ENCOUNTER — Ambulatory Visit: Payer: Medicare HMO | Admitting: Podiatry

## 2021-07-02 DIAGNOSIS — E11621 Type 2 diabetes mellitus with foot ulcer: Secondary | ICD-10-CM | POA: Diagnosis not present

## 2021-07-02 DIAGNOSIS — L97421 Non-pressure chronic ulcer of left heel and midfoot limited to breakdown of skin: Secondary | ICD-10-CM | POA: Diagnosis not present

## 2021-07-03 NOTE — Progress Notes (Signed)
  Subjective:  Patient ID: Ryna Beckstrom, female    DOB: 1930/08/05,  MRN: [id number]  Chief Complaint  Patient presents with   Diabetic Ulcer    3 week follow up diabetic ulcer left foot    87 y.o. female presents with the above complaint. History confirmed with patient.  Doing better they think it may be healed  Objective:  Physical Exam: warm, good capillary refill, no trophic changes or ulcerative lesions, normal DP and PT pulses, and normal sensory exam. Left Foot: Partial thickness ulceration is healed the lateral side has healed completely Assessment:   1. Diabetic ulcer of left midfoot associated with type 2 diabetes mellitus, limited to breakdown of skin (HCC)       Plan:  Patient was evaluated and treated and all questions answered.  Wound is healed and can discontinue ointment and bandaging and leave open to air and use a regular lotion  Received her paperwork for diabetic shoes and these been ordered and will be purchased and dispensed in a few weeks  Return in about 1 month (around 08/02/2021) for at risk diabetic foot care.

## 2021-07-08 DIAGNOSIS — Z0000 Encounter for general adult medical examination without abnormal findings: Secondary | ICD-10-CM | POA: Diagnosis not present

## 2021-07-08 DIAGNOSIS — Z1389 Encounter for screening for other disorder: Secondary | ICD-10-CM | POA: Diagnosis not present

## 2021-07-18 ENCOUNTER — Ambulatory Visit (INDEPENDENT_AMBULATORY_CARE_PROVIDER_SITE_OTHER): Payer: Medicare HMO | Admitting: Podiatrist

## 2021-07-18 ENCOUNTER — Other Ambulatory Visit: Payer: Self-pay

## 2021-07-18 ENCOUNTER — Encounter: Payer: Self-pay | Admitting: Podiatrist

## 2021-07-18 DIAGNOSIS — L97509 Non-pressure chronic ulcer of other part of unspecified foot with unspecified severity: Secondary | ICD-10-CM

## 2021-07-18 DIAGNOSIS — E11621 Type 2 diabetes mellitus with foot ulcer: Secondary | ICD-10-CM

## 2021-07-18 DIAGNOSIS — Z794 Long term (current) use of insulin: Secondary | ICD-10-CM

## 2021-07-18 NOTE — Progress Notes (Signed)
Patient presents today to pick up her diabetic shoes.  She tried on the shoes and states they feel a little small as her toes are touching the ends of the shoe.    The shoes are a 8.5 and she measures 8.5.  I recommended sending the shoes back for a size 9-  she would also like to swap the color for a black shoes.  The shoe will be ordered for her and we will call her when the new shoes and inserts are ready.

## 2021-07-30 ENCOUNTER — Other Ambulatory Visit: Payer: Self-pay

## 2021-07-30 ENCOUNTER — Ambulatory Visit (INDEPENDENT_AMBULATORY_CARE_PROVIDER_SITE_OTHER): Payer: Medicare HMO | Admitting: Podiatry

## 2021-07-30 ENCOUNTER — Ambulatory Visit (INDEPENDENT_AMBULATORY_CARE_PROVIDER_SITE_OTHER): Payer: Medicare HMO | Admitting: Podiatrist

## 2021-07-30 DIAGNOSIS — E11621 Type 2 diabetes mellitus with foot ulcer: Secondary | ICD-10-CM

## 2021-07-30 DIAGNOSIS — L97509 Non-pressure chronic ulcer of other part of unspecified foot with unspecified severity: Secondary | ICD-10-CM

## 2021-07-30 DIAGNOSIS — L97421 Non-pressure chronic ulcer of left heel and midfoot limited to breakdown of skin: Secondary | ICD-10-CM | POA: Diagnosis not present

## 2021-07-30 DIAGNOSIS — Z794 Long term (current) use of insulin: Secondary | ICD-10-CM | POA: Diagnosis not present

## 2021-07-30 NOTE — Progress Notes (Signed)
Patient presents to pick up her diabetic shoes and inserts-  the shoes have a defect where the velcro is coming apart from the shoe on the right shoes.  We will order her a new pair to replace the shoes-  the size also needs a small adjustment as her toes were just touching the end.  Will order her a half size larger and will call again when ready for pick up.

## 2021-07-30 NOTE — Progress Notes (Signed)
  Subjective:  Patient ID: Patricia Green, female    DOB: [birth year],  MRN: [id number]  Chief Complaint  Patient presents with   Diabetic Ulcer    87 y.o. female presents with the above complaint. History confirmed with patient.  Doing better continues to improve  Objective:  Physical Exam: warm, good capillary refill, no trophic changes or ulcerative lesions, normal DP and PT pulses, and normal sensory exam. Left Foot: Previous ulceration callus with no active skin breakdown Assessment:   1. Diabetic ulcer of left midfoot associated with type 2 diabetes mellitus, limited to breakdown of skin (HCC)   2. Type 2 diabetes mellitus with foot ulcer, with long-term current use of insulin (HCC)        Plan:  Patient was evaluated and treated and all questions answered.  Overall doing better and has healed at this point.  Discussed close surveillance.  I like to see her back in 8 weeks for recheck.  Her new shoes are ready today I hope that this will offload the area appropriately reduce the pressure she has and prevent recurrence  Return in about 8 weeks (around 09/24/2021) for callus / ulcer check .

## 2021-07-31 DIAGNOSIS — R413 Other amnesia: Secondary | ICD-10-CM | POA: Diagnosis not present

## 2021-07-31 DIAGNOSIS — Z681 Body mass index (BMI) 19 or less, adult: Secondary | ICD-10-CM | POA: Diagnosis not present

## 2021-07-31 DIAGNOSIS — H6120 Impacted cerumen, unspecified ear: Secondary | ICD-10-CM | POA: Diagnosis not present

## 2021-08-14 ENCOUNTER — Other Ambulatory Visit: Payer: Self-pay

## 2021-08-14 ENCOUNTER — Ambulatory Visit: Payer: Medicare HMO

## 2021-08-14 DIAGNOSIS — M2042 Other hammer toe(s) (acquired), left foot: Secondary | ICD-10-CM | POA: Diagnosis not present

## 2021-08-14 DIAGNOSIS — E109 Type 1 diabetes mellitus without complications: Secondary | ICD-10-CM | POA: Diagnosis not present

## 2021-08-14 DIAGNOSIS — E11621 Type 2 diabetes mellitus with foot ulcer: Secondary | ICD-10-CM | POA: Diagnosis not present

## 2021-08-14 DIAGNOSIS — H35363 Drusen (degenerative) of macula, bilateral: Secondary | ICD-10-CM | POA: Diagnosis not present

## 2021-08-14 DIAGNOSIS — H524 Presbyopia: Secondary | ICD-10-CM | POA: Diagnosis not present

## 2021-08-14 DIAGNOSIS — L97509 Non-pressure chronic ulcer of other part of unspecified foot with unspecified severity: Secondary | ICD-10-CM

## 2021-08-14 DIAGNOSIS — H26493 Other secondary cataract, bilateral: Secondary | ICD-10-CM | POA: Diagnosis not present

## 2021-08-14 DIAGNOSIS — L89899 Pressure ulcer of other site, unspecified stage: Secondary | ICD-10-CM | POA: Diagnosis not present

## 2021-08-14 NOTE — Progress Notes (Signed)
SITUATION Reason for Visit: Fitting of Diabetic Shoes & Insoles Patient / Caregiver Report:  Patient is satisfied with diabetic shoes  OBJECTIVE DATA: Patient History / Diagnosis:  Type 2 diabetes mellitus with foot ulcer, with long-term current use of insulin (HCC)  Change in Status:   None  ACTIONS PERFORMED: In-Person Delivery, patient was fit with: - 1x pair A5500 PDAC approved prefabricated Diabetic Shoes: Apex mary-janes  Shoes and insoles were verified for structural integrity and safety. Patient wore shoes and insoles in office. Skin was inspected and free of areas of concern after wearing shoes and inserts. Shoes and inserts fit properly. Patient / Caregiver provided with ferbal instruction and demonstration regarding donning, doffing, wear, care, proper fit, function, purpose, cleaning, and use of shoes and insoles ' and in all related precautions and risks and benefits regarding shoes and insoles. Patient / Caregiver was instructed to wear properly fitting socks with shoes at all times. Patient was also provided with verbal instruction regarding how to report any failures or malfunctions of shoes or inserts, and necessary follow up care. Patient / Caregiver was also instructed to contact physician regarding change in status that may affect function of shoes and inserts.   Patient / Caregiver verbalized undersatnding of instruction provided. Patient / Caregiver demonstrated independence with proper donning and doffing of shoes and inserts.  PLAN Patient to follow up as needed. Plan of care was discussed with and agreed upon by patient and/or caregiver. All questions were answered and concerns addressed.

## 2021-08-22 DIAGNOSIS — H26493 Other secondary cataract, bilateral: Secondary | ICD-10-CM | POA: Diagnosis not present

## 2021-09-24 ENCOUNTER — Ambulatory Visit (INDEPENDENT_AMBULATORY_CARE_PROVIDER_SITE_OTHER): Payer: Medicare HMO | Admitting: Podiatry

## 2021-09-24 ENCOUNTER — Other Ambulatory Visit: Payer: Self-pay

## 2021-09-24 DIAGNOSIS — B351 Tinea unguium: Secondary | ICD-10-CM | POA: Diagnosis not present

## 2021-09-24 DIAGNOSIS — M79675 Pain in left toe(s): Secondary | ICD-10-CM

## 2021-09-24 DIAGNOSIS — E1142 Type 2 diabetes mellitus with diabetic polyneuropathy: Secondary | ICD-10-CM | POA: Diagnosis not present

## 2021-09-24 DIAGNOSIS — L84 Corns and callosities: Secondary | ICD-10-CM | POA: Diagnosis not present

## 2021-09-24 DIAGNOSIS — M79674 Pain in right toe(s): Secondary | ICD-10-CM | POA: Diagnosis not present

## 2021-09-24 DIAGNOSIS — Z794 Long term (current) use of insulin: Secondary | ICD-10-CM | POA: Diagnosis not present

## 2021-09-25 NOTE — Progress Notes (Signed)
°  Subjective:  Patient ID: Patricia Green, female    DOB: 04-12-1930,  MRN: [id number]  Chief Complaint  Patient presents with   Callouses    F/U Lt callous/ulcer -pt states ulcer remains healed - no issues tx: moisterizer    88 y.o. female presents with the above complaint. History confirmed with patient.  Doing okay the shoes have been comfortable and not causing pressure  Objective:  Physical Exam: warm, good capillary refill, no trophic changes or ulcerative lesions, normal DP and PT pulses, and reduced protective sensation to the toes.  Bilaterally she has thickened elongated yellow discolored nails with subungual debris Left Foot: Preulcerative callus with no active skin breakdown Assessment:   1. Pain due to onychomycosis of toenails of both feet   2. Pre-ulcerative calluses   3. Type 2 diabetes mellitus with diabetic polyneuropathy, with long-term current use of insulin ([place])         Plan:  Patient was evaluated and treated and all questions answered.  Discussed the etiology and treatment options for the condition in detail with the patient. Educated patient on the topical and oral treatment options for mycotic nails. Recommended debridement of the nails today. Sharp and mechanical debridement performed of all painful and mycotic nails today. Nails debrided in length and thickness using a nail nipper to level of comfort. Discussed treatment options including appropriate shoe gear. Follow up as needed for painful nails.   All symptomatic hyperkeratoses were safely debrided with a sterile #15 blade to patient's level of comfort without incident. We discussed preventative and palliative care of these lesions including supportive and accommodative shoegear, padding, prefabricated and custom molded accommodative orthoses, use of a pumice stone and lotions/creams daily.   Return in about 12 weeks (around 12/17/2021) for at risk diabetic foot care.

## 2021-10-07 ENCOUNTER — Encounter: Payer: Self-pay | Admitting: Psychology

## 2021-10-24 ENCOUNTER — Ambulatory Visit: Payer: Medicare HMO | Admitting: Podiatry

## 2021-10-29 ENCOUNTER — Ambulatory Visit: Payer: Medicare HMO | Admitting: Podiatry

## 2021-10-29 ENCOUNTER — Other Ambulatory Visit: Payer: Self-pay

## 2021-10-29 DIAGNOSIS — L97511 Non-pressure chronic ulcer of other part of right foot limited to breakdown of skin: Secondary | ICD-10-CM

## 2021-10-29 MED ORDER — MUPIROCIN 2 % EX OINT: 1.0000 "application " | TOPICAL_OINTMENT | Freq: Every day | CUTANEOUS | 2 refills | Status: AC

## 2021-11-03 NOTE — Progress Notes (Signed)
°  Subjective:  Patient ID: Patricia Green, female    DOB: 29-Jan-1930,  MRN: [id number]  Chief Complaint  Patient presents with   Diabetic Ulcer    L great toe has ulcer - diabetic     88 y.o. female presents with the above complaint. History confirmed with patient.  Returns to reevaluate right great toe she got new slippers and thinks that that may have caused the issue.  Objective:  Physical Exam: warm, good capillary refill, no trophic changes or ulcerative lesions, normal DP and PT pulses, and reduced protective sensation to the toes.  Bilaterally she has thickened elongated yellow discolored nails with subungual debris Left Foot: Preulcerative callus with no active skin breakdown  Preulcerative callus present on the right great toe debridement shows small superficial skin breakdown Assessment:     1. Ulcer of great toe, right, limited to breakdown of skin [facility])          Plan:  Patient was evaluated and treated and all questions answered.  Thankfully appears to have resolved after they got rid of the slippers.  I recommended they apply mupirocin ointment to the area daily.  I will see her back in 4 weeks for reevaluation.   Return in about 4 weeks (around 11/26/2021).

## 2021-12-11 ENCOUNTER — Encounter: Payer: Medicare HMO | Admitting: Psychology

## 2021-12-17 ENCOUNTER — Ambulatory Visit: Payer: Medicare HMO | Admitting: Podiatry

## 2021-12-17 DIAGNOSIS — L84 Corns and callosities: Secondary | ICD-10-CM

## 2021-12-17 DIAGNOSIS — B351 Tinea unguium: Secondary | ICD-10-CM | POA: Diagnosis not present

## 2021-12-17 DIAGNOSIS — E1142 Type 2 diabetes mellitus with diabetic polyneuropathy: Secondary | ICD-10-CM

## 2021-12-17 DIAGNOSIS — M79675 Pain in left toe(s): Secondary | ICD-10-CM

## 2021-12-17 DIAGNOSIS — M79674 Pain in right toe(s): Secondary | ICD-10-CM

## 2021-12-17 DIAGNOSIS — Z794 Long term (current) use of insulin: Secondary | ICD-10-CM | POA: Diagnosis not present

## 2021-12-21 NOTE — Progress Notes (Signed)
?  Subjective:  ?Patient ID: Patricia Green, female    DOB: November 16, 1929,  MRN: [id number] ? ?Chief Complaint  ?Patient presents with  ? Diabetic Ulcer  ?  12wk risk diabetic care  ? ? ?88 y.o. female presents with the above complaint. History confirmed with patient.  Doing okay the shoes have been comfortable and not causing pressure ? ?Objective:  ?Physical Exam: ?warm, good capillary refill, no trophic changes or ulcerative lesions, normal DP and PT pulses, and reduced protective sensation to the toes.  Bilaterally she has thickened elongated yellow discolored nails with subungual debris ?Left Foot: Preulcerative callus with no active skin breakdown ?Assessment:  ? ?1. Pain due to onychomycosis of toenails of both feet   ?2. Pre-ulcerative calluses   ?3. Type 2 diabetes mellitus with diabetic polyneuropathy, with long-term current use of insulin (HCC)   ? ? ? ? ? ? ? ?Plan:  ?Patient was evaluated and treated and all questions answered. ? ?Discussed the etiology and treatment options for the condition in detail with the patient. Educated patient on the topical and oral treatment options for mycotic nails. Recommended debridement of the nails today. Sharp and mechanical debridement performed of all painful and mycotic nails today. Nails debrided in length and thickness using a nail nipper to level of comfort. Discussed treatment options including appropriate shoe gear. Follow up as needed for painful nails. ? ? ?All symptomatic hyperkeratoses were safely debrided with a sterile #15 blade to patient's level of comfort without incident. We discussed preventative and palliative care of these lesions including supportive and accommodative shoegear, padding, prefabricated and custom molded accommodative orthoses, use of a pumice stone and lotions/creams daily. ? ? ?Return in about 12 weeks (around 03/11/2022) for at risk diabetic foot care.  ? ? ? ?

## 2022-01-21 DIAGNOSIS — L988 Other specified disorders of the skin and subcutaneous tissue: Secondary | ICD-10-CM | POA: Diagnosis not present

## 2022-01-21 DIAGNOSIS — E46 Unspecified protein-calorie malnutrition: Secondary | ICD-10-CM | POA: Diagnosis not present

## 2022-01-21 DIAGNOSIS — N1832 Chronic kidney disease, stage 3b: Secondary | ICD-10-CM | POA: Diagnosis not present

## 2022-01-21 DIAGNOSIS — E1169 Type 2 diabetes mellitus with other specified complication: Secondary | ICD-10-CM | POA: Diagnosis not present

## 2022-01-21 DIAGNOSIS — I10 Essential (primary) hypertension: Secondary | ICD-10-CM | POA: Diagnosis not present

## 2022-01-21 DIAGNOSIS — Z794 Long term (current) use of insulin: Secondary | ICD-10-CM | POA: Diagnosis not present

## 2022-01-30 DIAGNOSIS — L03114 Cellulitis of left upper limb: Secondary | ICD-10-CM | POA: Diagnosis not present

## 2022-02-05 ENCOUNTER — Other Ambulatory Visit: Payer: Self-pay | Admitting: Family Medicine

## 2022-02-05 DIAGNOSIS — Z1231 Encounter for screening mammogram for malignant neoplasm of breast: Secondary | ICD-10-CM

## 2022-02-13 ENCOUNTER — Ambulatory Visit
Admission: RE | Admit: 2022-02-13 | Discharge: 2022-02-13 | Disposition: A | Discharge status: Home / Self Care | Payer: Medicare HMO | Source: Ambulatory Visit | Attending: Family Medicine | Admitting: Family Medicine

## 2022-02-13 DIAGNOSIS — Z1231 Encounter for screening mammogram for malignant neoplasm of breast: Secondary | ICD-10-CM

## 2022-02-17 ENCOUNTER — Other Ambulatory Visit: Payer: Self-pay | Admitting: Family Medicine

## 2022-02-17 DIAGNOSIS — R928 Other abnormal and inconclusive findings on diagnostic imaging of breast: Secondary | ICD-10-CM

## 2022-02-21 ENCOUNTER — Ambulatory Visit
Admission: RE | Admit: 2022-02-21 | Discharge: 2022-02-21 | Disposition: A | Discharge status: Home / Self Care | Payer: Medicare HMO | Source: Ambulatory Visit | Attending: Family Medicine | Admitting: Family Medicine

## 2022-02-21 DIAGNOSIS — R928 Other abnormal and inconclusive findings on diagnostic imaging of breast: Secondary | ICD-10-CM

## 2022-03-25 ENCOUNTER — Ambulatory Visit: Payer: Medicare HMO | Admitting: Podiatry

## 2022-04-03 ENCOUNTER — Ambulatory Visit: Payer: Medicare HMO | Admitting: Psychology

## 2023-02-05 IMAGING — DX DG PORTABLE PELVIS
2 series · 2 of 2 positions shown · non-contrast
Comparison: X-ray C-arm 12/21/2020, x-ray hip 12/20/2020

CLINICAL DATA: Fracture.

EXAM:
PORTABLE PELVIS 1-2 VIEWS

[pelvis ap (1 of 2)]
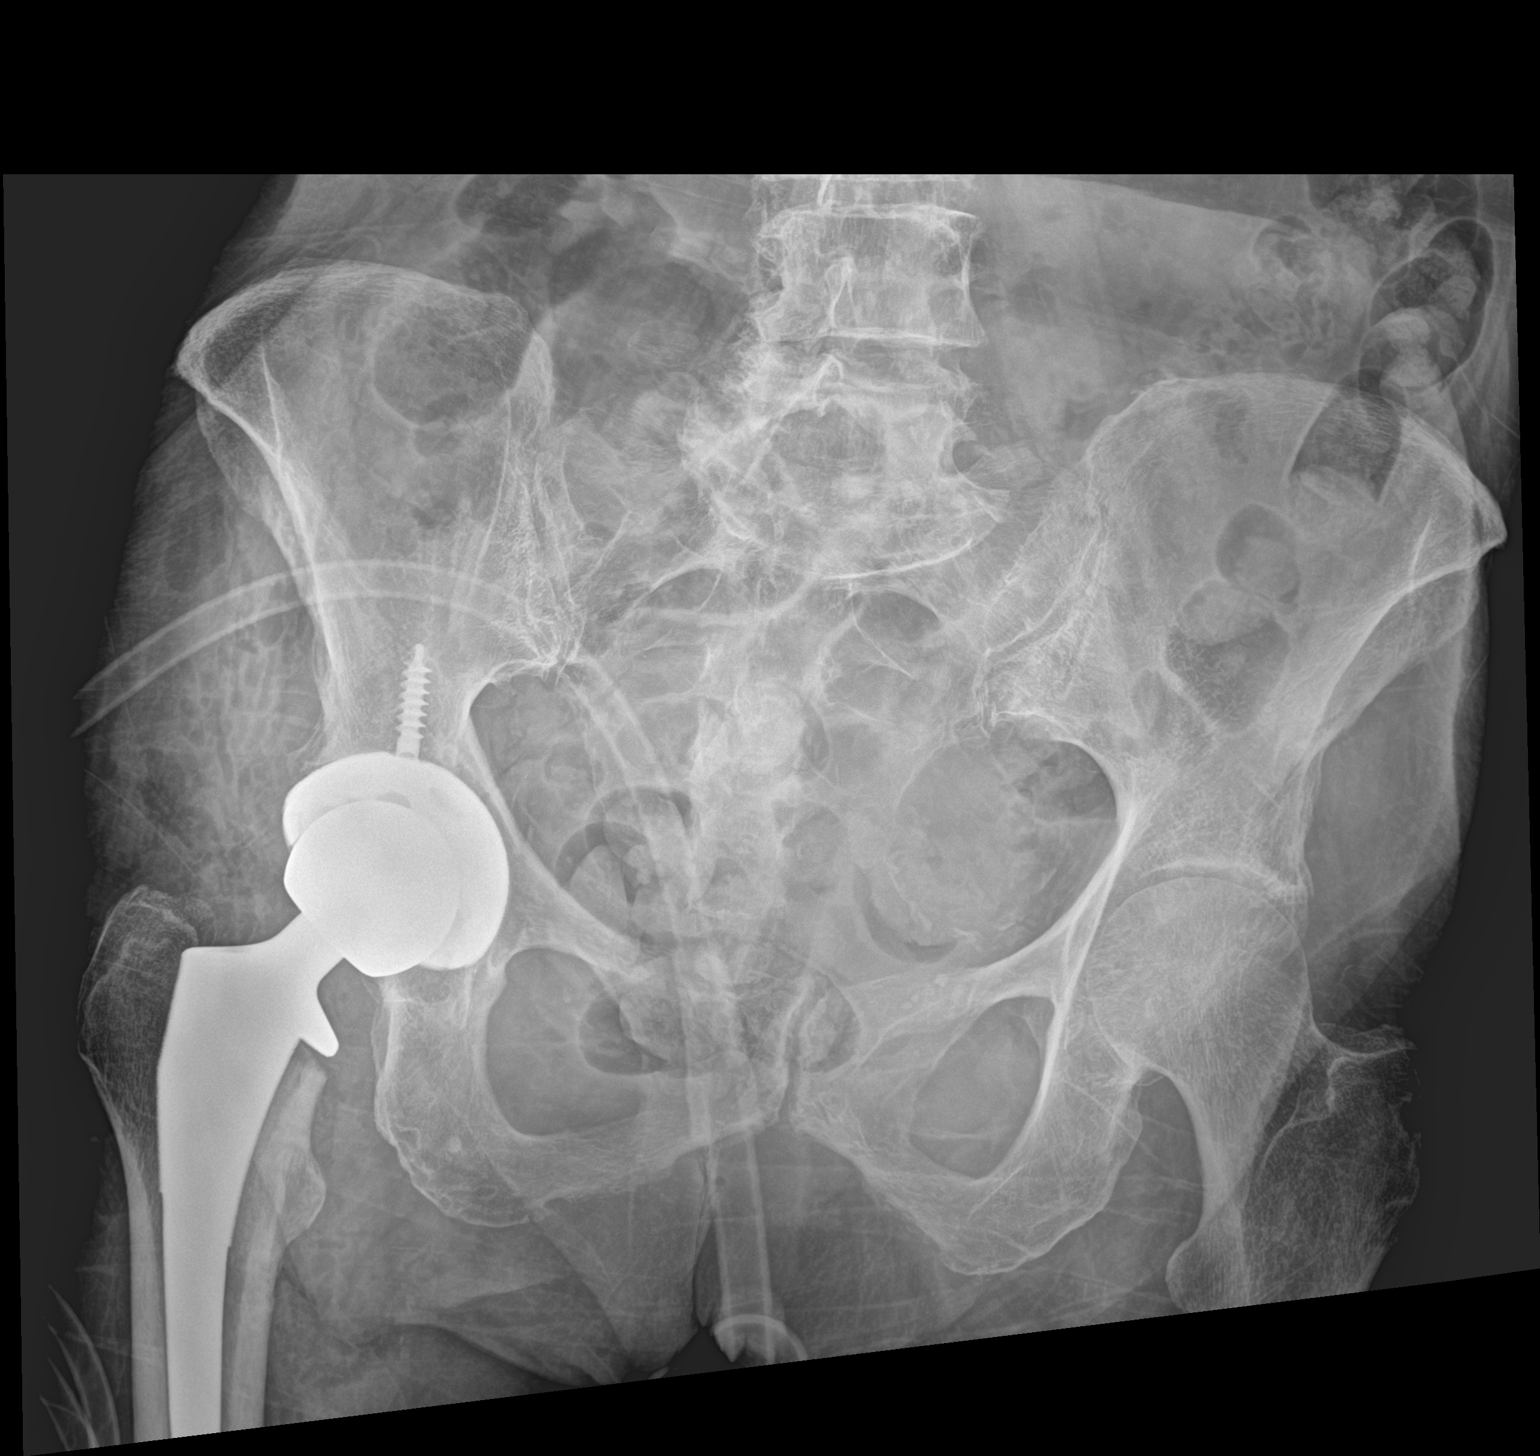

[pelvis ap (2 of 2)]
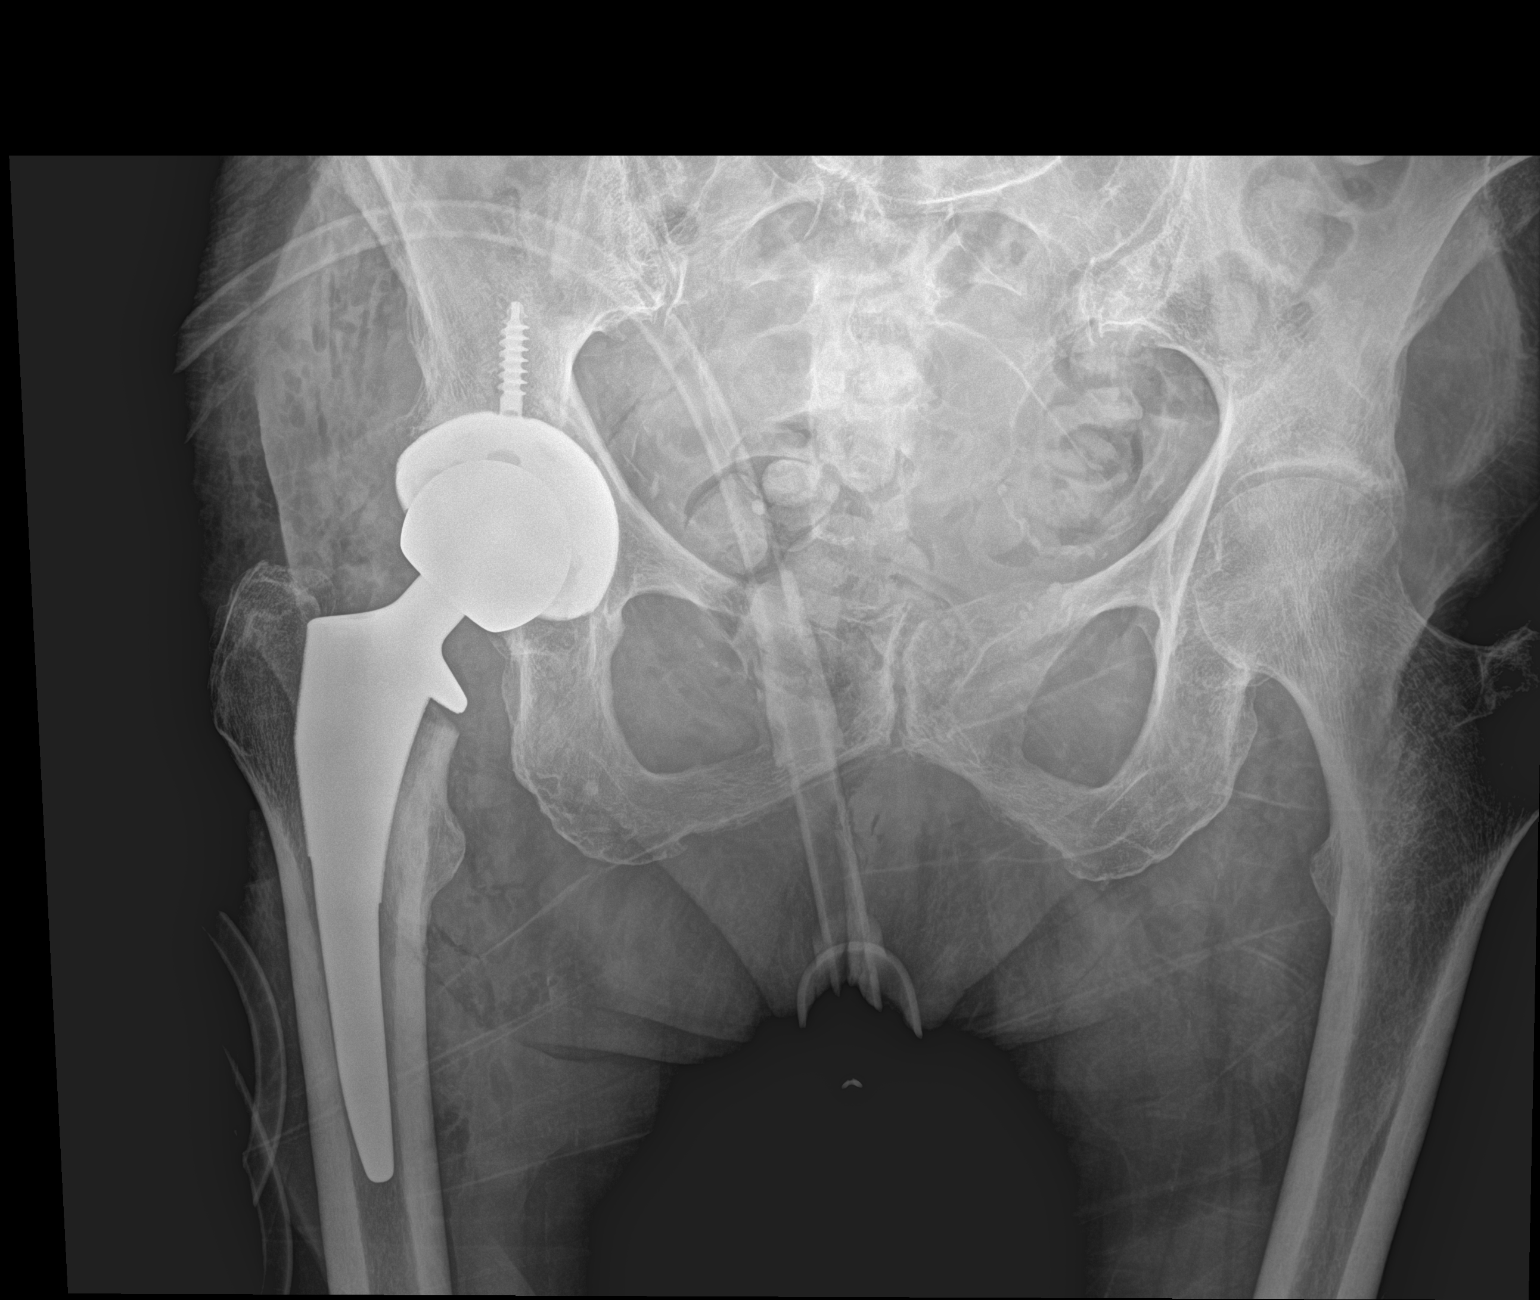

[2 of 2 positions shown; findings below may reference images not displayed]

FINDINGS: Status post open reduction and internal fixation of the right hip
with total right hip arthroplasty. There is no evidence of new acute
pelvic fracture or diastasis. No pelvic bone lesions are seen.
Subcutaneus soft tissue edema and emphysema of the right hip
consistent with postsurgical changes.
IMPRESSION: Status post total right hip arthroplasty. No radiographic findings
of surgical complication.

## 2023-02-05 IMAGING — RF DG C-ARM 1-60 MIN
1 series · 2 of 2 positions shown · non-contrast
Comparison: Preoperative radiograph 12/20/2020

CLINICAL DATA: Total hip arthroplasty.

EXAM:
OPERATIVE RIGHT HIP (WITH PELVIS IF PERFORMED)
TECHNIQUE: Fluoroscopic spot image(s) were submitted for interpretation
post-operatively.

[Series 1: unknown protocol · 0.20mm/px · 2 of 2 slices shown]
[im 1/2]
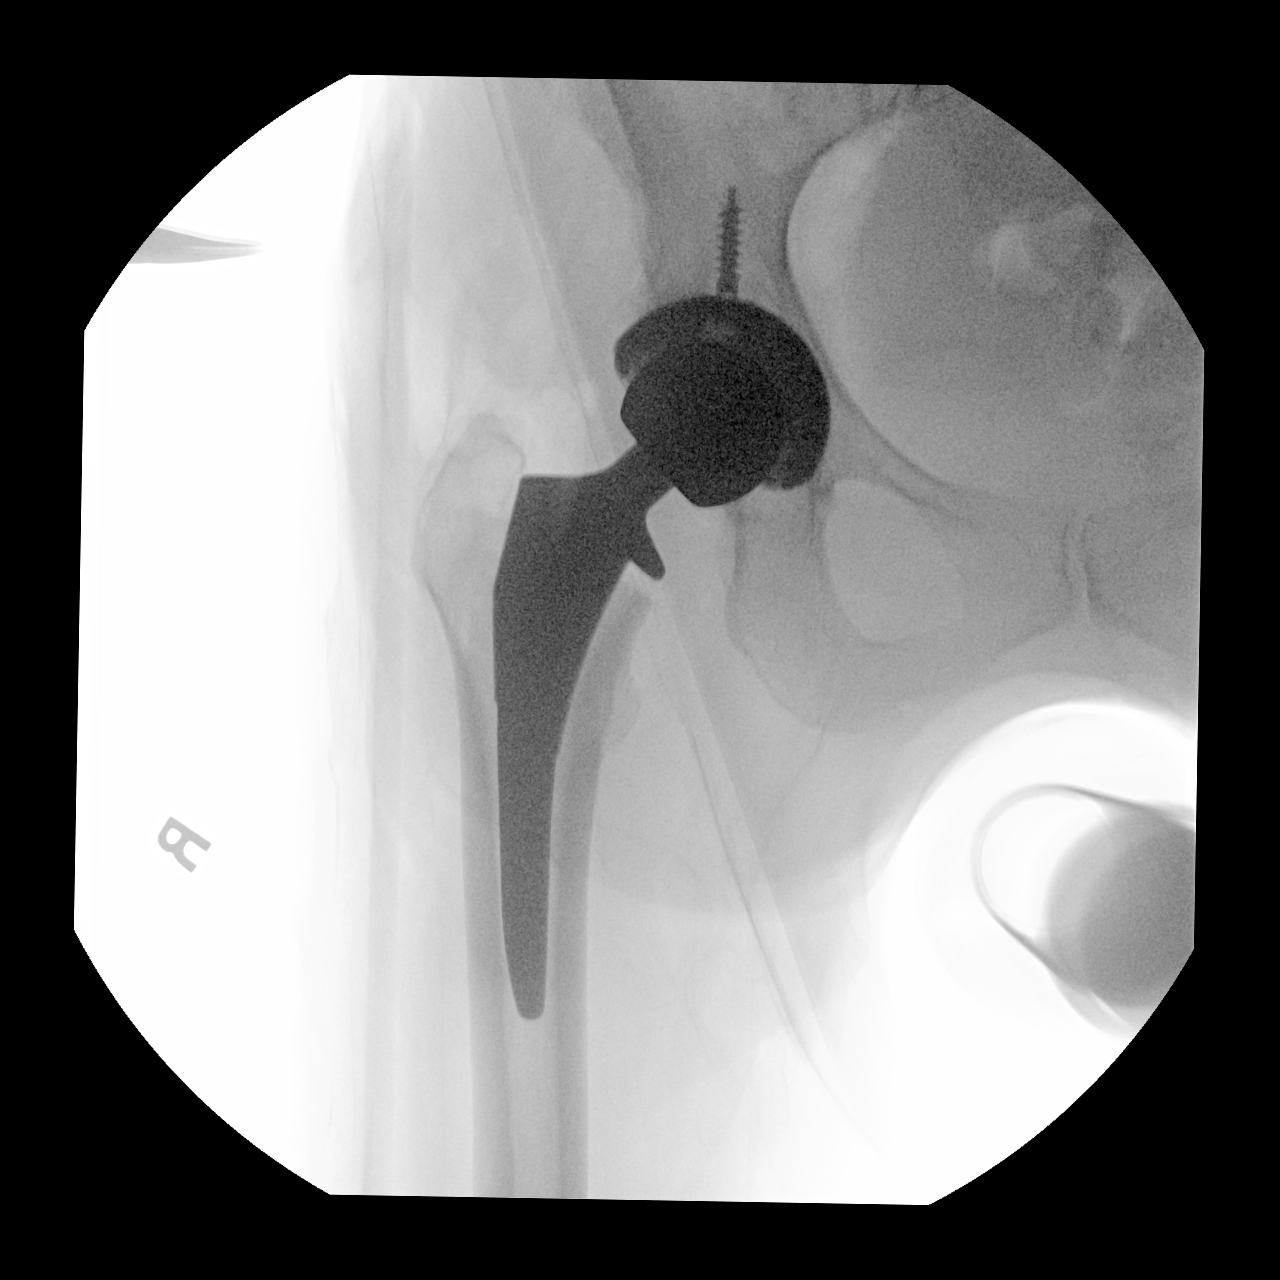
[im 2/2]
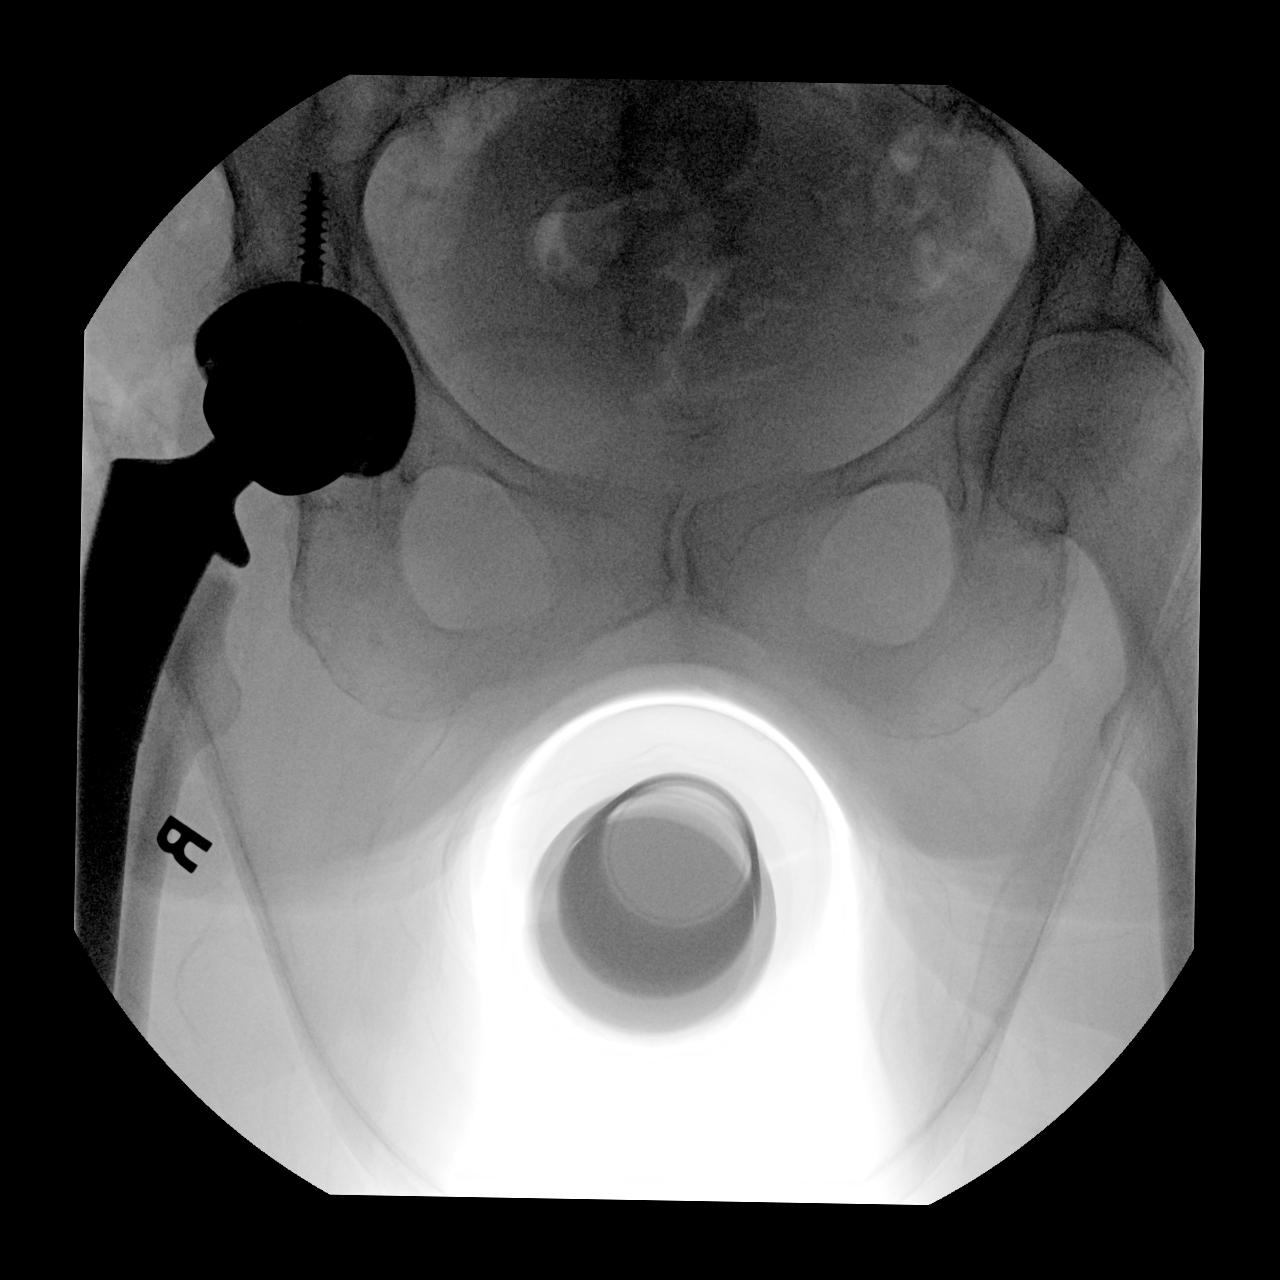

[2 of 2 positions shown; findings below may reference images not displayed]

FINDINGS: Two fluoroscopic spot views of the right hip in frontal projection
demonstrate total hip arthroplasty in expected alignment. Total
fluoroscopy in 20.1 seconds. Total dose 1.2373 mGy.
IMPRESSION: Procedural fluoroscopy for right hip arthroplasty.

## 2023-02-05 IMAGING — RF DG HIP (WITH PELVIS) OPERATIVE*R*
1 series · 2 of 2 positions shown · non-contrast
Comparison: Preoperative radiograph 12/20/2020

CLINICAL DATA: Total hip arthroplasty.

EXAM:
OPERATIVE RIGHT HIP (WITH PELVIS IF PERFORMED)
TECHNIQUE: Fluoroscopic spot image(s) were submitted for interpretation
post-operatively.

[Series 1: unknown protocol · 0.20mm/px · 2 of 2 slices shown]
[im 1/2]
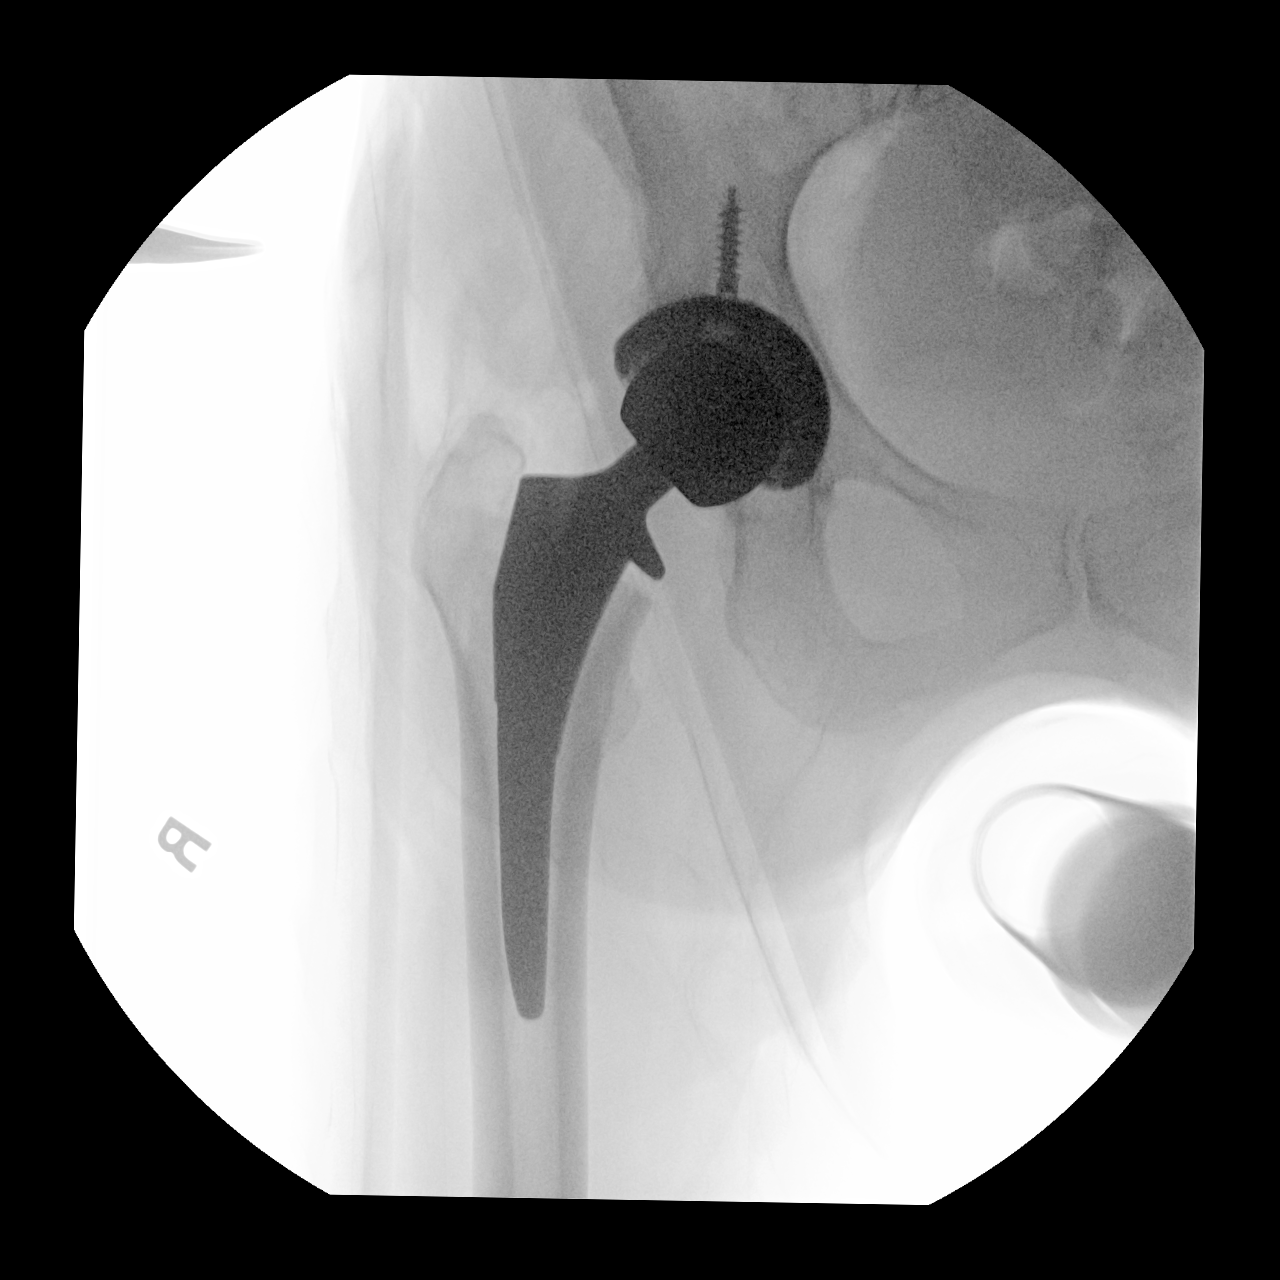
[im 2/2]
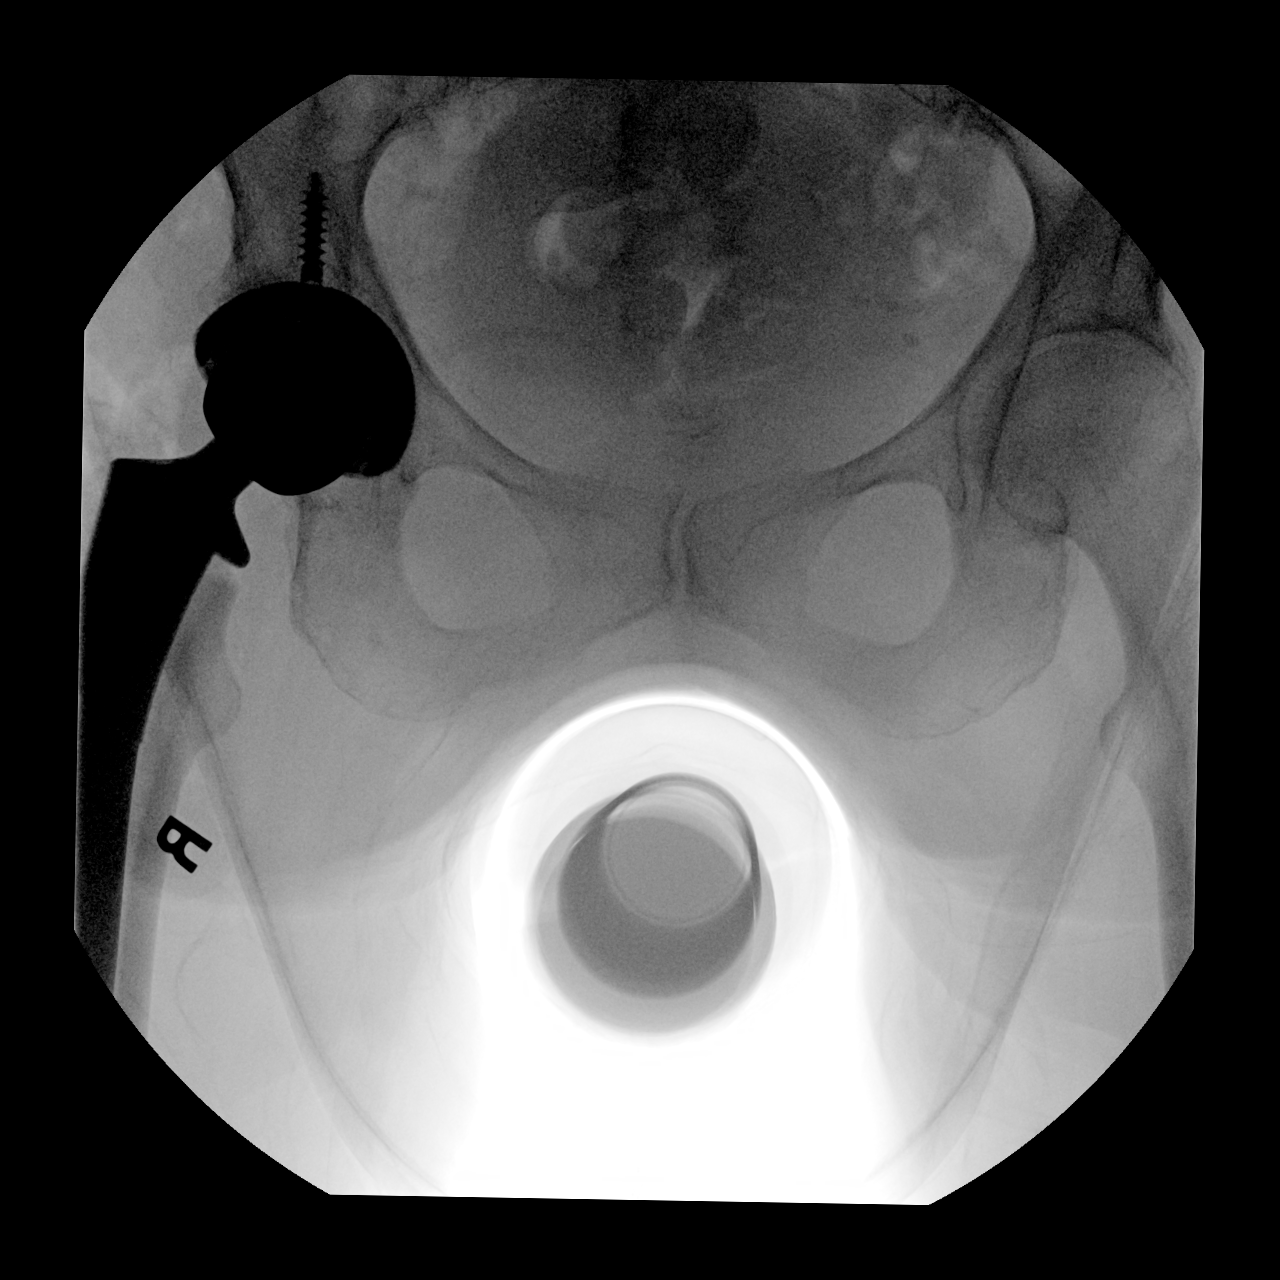

[2 of 2 positions shown; findings below may reference images not displayed]

FINDINGS: Two fluoroscopic spot views of the right hip in frontal projection
demonstrate total hip arthroplasty in expected alignment. Total
fluoroscopy in 20.1 seconds. Total dose 1.2373 mGy.
IMPRESSION: Procedural fluoroscopy for right hip arthroplasty.
